# Patient Record
Sex: Female | Born: 1945 | Race: White | Hispanic: No | Marital: Married | Smoking: Former smoker
Health system: Southern US, Community
[De-identification: ages and names within clinical notes are randomized; demographics above are authoritative.]

## PROBLEM LIST (undated history)

## (undated) DIAGNOSIS — J449 Chronic obstructive pulmonary disease, unspecified: Secondary | ICD-10-CM

## (undated) DIAGNOSIS — I10 Essential (primary) hypertension: Secondary | ICD-10-CM

## (undated) DIAGNOSIS — Z853 Personal history of malignant neoplasm of breast: Secondary | ICD-10-CM

## (undated) DIAGNOSIS — K219 Gastro-esophageal reflux disease without esophagitis: Secondary | ICD-10-CM

## (undated) DIAGNOSIS — Z860100 Personal history of colon polyps, unspecified: Secondary | ICD-10-CM

## (undated) DIAGNOSIS — D649 Anemia, unspecified: Secondary | ICD-10-CM

## (undated) DIAGNOSIS — K7460 Unspecified cirrhosis of liver: Secondary | ICD-10-CM

## (undated) DIAGNOSIS — M1990 Unspecified osteoarthritis, unspecified site: Secondary | ICD-10-CM

## (undated) DIAGNOSIS — R011 Cardiac murmur, unspecified: Secondary | ICD-10-CM

## (undated) DIAGNOSIS — E079 Disorder of thyroid, unspecified: Secondary | ICD-10-CM

## (undated) DIAGNOSIS — E538 Deficiency of other specified B group vitamins: Secondary | ICD-10-CM

## (undated) DIAGNOSIS — K222 Esophageal obstruction: Secondary | ICD-10-CM

## (undated) DIAGNOSIS — E785 Hyperlipidemia, unspecified: Secondary | ICD-10-CM

## (undated) HISTORY — PX: BREAST BIOPSY: SHX20

## (undated) HISTORY — DX: Hyperlipidemia, unspecified: E78.5

## (undated) HISTORY — DX: Unspecified cirrhosis of liver: K74.60

## (undated) HISTORY — DX: Hypercalcemia: E83.52

## (undated) HISTORY — DX: Essential (primary) hypertension: I10

## (undated) HISTORY — PX: HERNIA REPAIR: SHX51

## (undated) HISTORY — DX: Disorder of thyroid, unspecified: E07.9

## (undated) HISTORY — DX: Cardiac murmur, unspecified: R01.1

## (undated) HISTORY — DX: Chronic obstructive pulmonary disease, unspecified: J44.9

## (undated) HISTORY — DX: Anemia, unspecified: D64.9

## (undated) HISTORY — DX: Gastro-esophageal reflux disease without esophagitis: K21.9

## (undated) HISTORY — DX: Personal history of malignant neoplasm of breast: Z85.3

## (undated) HISTORY — DX: Deficiency of other specified B group vitamins: E53.8

## (undated) HISTORY — DX: Personal history of colon polyps, unspecified: Z86.0100

## (undated) HISTORY — DX: Personal history of colonic polyps: Z86.010

## (undated) HISTORY — PX: BACK SURGERY: SHX140

## (undated) HISTORY — DX: Esophageal obstruction: K22.2

## (undated) HISTORY — DX: Unspecified osteoarthritis, unspecified site: M19.90

---

## 2016-01-19 DIAGNOSIS — D509 Iron deficiency anemia, unspecified: Secondary | ICD-10-CM | POA: Diagnosis not present

## 2016-01-19 DIAGNOSIS — Z853 Personal history of malignant neoplasm of breast: Secondary | ICD-10-CM | POA: Diagnosis not present

## 2017-02-05 DIAGNOSIS — Z853 Personal history of malignant neoplasm of breast: Secondary | ICD-10-CM | POA: Diagnosis not present

## 2017-02-05 DIAGNOSIS — Z170 Estrogen receptor positive status [ER+]: Secondary | ICD-10-CM | POA: Diagnosis not present

## 2017-02-05 DIAGNOSIS — Z923 Personal history of irradiation: Secondary | ICD-10-CM | POA: Diagnosis not present

## 2017-02-05 DIAGNOSIS — Z9221 Personal history of antineoplastic chemotherapy: Secondary | ICD-10-CM | POA: Diagnosis not present

## 2017-08-16 ENCOUNTER — Telehealth: Payer: Self-pay | Admitting: Gastroenterology

## 2017-08-16 NOTE — Telephone Encounter (Signed)
Would you like to give patient 90 days supply, and how many refills?

## 2017-08-17 MED ORDER — OMEPRAZOLE 20 MG PO CPDR: 20.0000 mg | DELAYED_RELEASE_CAPSULE | Freq: Every day | ORAL | 3 refills | Status: DC

## 2017-08-17 NOTE — Telephone Encounter (Signed)
Sent refill to patients pharmacy. 

## 2017-08-17 NOTE — Telephone Encounter (Signed)
Same dose, 90-day supply , 4 refills Cannot pull up any records-await de-conversion

## 2018-08-05 ENCOUNTER — Other Ambulatory Visit: Payer: Self-pay | Admitting: Gastroenterology

## 2018-09-26 DIAGNOSIS — N390 Urinary tract infection, site not specified: Secondary | ICD-10-CM | POA: Diagnosis not present

## 2018-09-26 DIAGNOSIS — R112 Nausea with vomiting, unspecified: Secondary | ICD-10-CM

## 2018-09-26 DIAGNOSIS — J449 Chronic obstructive pulmonary disease, unspecified: Secondary | ICD-10-CM

## 2018-09-26 DIAGNOSIS — J9611 Chronic respiratory failure with hypoxia: Secondary | ICD-10-CM | POA: Diagnosis not present

## 2018-09-26 DIAGNOSIS — K449 Diaphragmatic hernia without obstruction or gangrene: Secondary | ICD-10-CM

## 2018-09-26 DIAGNOSIS — R109 Unspecified abdominal pain: Secondary | ICD-10-CM | POA: Diagnosis not present

## 2018-09-26 DIAGNOSIS — K8020 Calculus of gallbladder without cholecystitis without obstruction: Secondary | ICD-10-CM | POA: Diagnosis not present

## 2018-09-26 DIAGNOSIS — I10 Essential (primary) hypertension: Secondary | ICD-10-CM

## 2018-09-27 DIAGNOSIS — J9611 Chronic respiratory failure with hypoxia: Secondary | ICD-10-CM | POA: Diagnosis not present

## 2018-09-27 DIAGNOSIS — N390 Urinary tract infection, site not specified: Secondary | ICD-10-CM | POA: Diagnosis not present

## 2018-09-27 DIAGNOSIS — K8020 Calculus of gallbladder without cholecystitis without obstruction: Secondary | ICD-10-CM | POA: Diagnosis not present

## 2018-09-27 DIAGNOSIS — R109 Unspecified abdominal pain: Secondary | ICD-10-CM | POA: Diagnosis not present

## 2018-09-28 DIAGNOSIS — N390 Urinary tract infection, site not specified: Secondary | ICD-10-CM | POA: Diagnosis not present

## 2018-09-28 DIAGNOSIS — J9611 Chronic respiratory failure with hypoxia: Secondary | ICD-10-CM | POA: Diagnosis not present

## 2018-09-28 DIAGNOSIS — K8020 Calculus of gallbladder without cholecystitis without obstruction: Secondary | ICD-10-CM | POA: Diagnosis not present

## 2018-09-28 DIAGNOSIS — R109 Unspecified abdominal pain: Secondary | ICD-10-CM | POA: Diagnosis not present

## 2019-07-31 ENCOUNTER — Other Ambulatory Visit: Payer: Self-pay | Admitting: Gastroenterology

## 2020-01-08 DIAGNOSIS — Z01818 Encounter for other preprocedural examination: Secondary | ICD-10-CM

## 2020-01-09 HISTORY — PX: REPLACEMENT TOTAL KNEE: SUR1224

## 2020-01-27 ENCOUNTER — Other Ambulatory Visit: Payer: Self-pay | Admitting: Gastroenterology

## 2020-03-09 ENCOUNTER — Other Ambulatory Visit: Payer: Self-pay | Admitting: Gastroenterology

## 2020-03-15 DIAGNOSIS — R590 Localized enlarged lymph nodes: Secondary | ICD-10-CM | POA: Insufficient documentation

## 2020-04-16 ENCOUNTER — Telehealth: Payer: Self-pay | Admitting: Oncology

## 2020-04-16 NOTE — Telephone Encounter (Signed)
Patient re-referred for New Dx: Mediastinal Mass.  Appt made for 04/20/2020 Labs 11:30 am - Consult 12 pm  Patient stated she understood she was being referred for abnormal Lymph Nodes

## 2020-04-18 ENCOUNTER — Other Ambulatory Visit: Payer: Self-pay | Admitting: Oncology

## 2020-04-18 DIAGNOSIS — C50412 Malignant neoplasm of upper-outer quadrant of left female breast: Secondary | ICD-10-CM

## 2020-04-20 ENCOUNTER — Other Ambulatory Visit: Payer: Self-pay

## 2020-04-20 ENCOUNTER — Other Ambulatory Visit: Payer: Self-pay | Admitting: Hematology and Oncology

## 2020-04-20 ENCOUNTER — Encounter: Payer: Self-pay | Admitting: Oncology

## 2020-04-20 ENCOUNTER — Inpatient Hospital Stay (INDEPENDENT_AMBULATORY_CARE_PROVIDER_SITE_OTHER): Payer: Medicare Other | Admitting: Oncology

## 2020-04-20 ENCOUNTER — Other Ambulatory Visit: Payer: Self-pay | Admitting: Oncology

## 2020-04-20 ENCOUNTER — Inpatient Hospital Stay: Payer: Medicare Other | Attending: Oncology

## 2020-04-20 VITALS — BP 159/74 | HR 74 | Temp 98.9°F | Resp 18 | Ht 62.0 in | Wt 176.7 lb

## 2020-04-20 DIAGNOSIS — R590 Localized enlarged lymph nodes: Secondary | ICD-10-CM | POA: Diagnosis not present

## 2020-04-20 DIAGNOSIS — C50412 Malignant neoplasm of upper-outer quadrant of left female breast: Secondary | ICD-10-CM

## 2020-04-20 DIAGNOSIS — D509 Iron deficiency anemia, unspecified: Secondary | ICD-10-CM | POA: Diagnosis present

## 2020-04-20 DIAGNOSIS — J9859 Other diseases of mediastinum, not elsewhere classified: Secondary | ICD-10-CM | POA: Diagnosis present

## 2020-04-20 DIAGNOSIS — Z853 Personal history of malignant neoplasm of breast: Secondary | ICD-10-CM | POA: Insufficient documentation

## 2020-04-20 DIAGNOSIS — D649 Anemia, unspecified: Secondary | ICD-10-CM

## 2020-04-20 DIAGNOSIS — J9589 Other postprocedural complications and disorders of respiratory system, not elsewhere classified: Secondary | ICD-10-CM | POA: Diagnosis not present

## 2020-04-20 LAB — HEPATIC FUNCTION PANEL
ALT: 20 (ref 7–35)
AST: 52 — AB (ref 13–35)
Alkaline Phosphatase: 160 — AB (ref 25–125)
Bilirubin, Total: 0.3

## 2020-04-20 LAB — COMPREHENSIVE METABOLIC PANEL
Albumin: 4.2 (ref 3.5–5.0)
Calcium: 9.6 (ref 8.7–10.7)

## 2020-04-20 LAB — BASIC METABOLIC PANEL
BUN: 9 (ref 4–21)
CO2: 25 — AB (ref 13–22)
Chloride: 105 (ref 99–108)
Creatinine: 0.7 (ref 0.5–1.1)
Glucose: 98
Potassium: 4.1 (ref 3.4–5.3)
Sodium: 139 (ref 137–147)

## 2020-04-20 LAB — IRON,TIBC AND FERRITIN PANEL
%SAT: 8.9
Ferritin: 52.1
Iron: 32
TIBC: 357

## 2020-04-20 LAB — CBC: RBC: 4 (ref 3.87–5.11)

## 2020-04-20 LAB — VITAMIN B12: Vitamin B-12: >1000

## 2020-04-20 LAB — CBC AND DIFFERENTIAL
HCT: 34 — AB (ref 36–46)
Hemoglobin: 10.9 — AB (ref 12.0–16.0)
Neutrophils Absolute: 4.82
Platelets: 252 (ref 150–399)
WBC: 6.7

## 2020-04-20 NOTE — Progress Notes (Signed)
[place]  [address] [place],  [place]  [address] [phone]  Clinic Day:  04/20/2020  Referring physician: Raina Mina., MD   This document serves as a record of services personally performed by Hosie Poisson, MD. It was created on their behalf by [facility] E, a trained medical scribe. The creation of this record is based on the scribe's personal observations and the provider's statements to them.   CHIEF COMPLAINT:  CC: Large mediastinal mass  Current Treatment:  Will plan for PET imaging first and possible biopsy   HISTORY OF PRESENT ILLNESS:  Tammy [place] is a 75 y.o. female who I have seen previously, but is now referred by Kathrynn Ducking, NP,  for the evaluation and treatment of a mediastinal mass.  This began when the patient began to experience episodes of dysphagia and occasions of food getting stuck in her throat.  Intermittently she would have to throw her food back up as it would not go down.  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 Lyndel Safe and another time with Dr.Butler.  The last time was 3-4 years ago.  The patient had also noticed a 21 pound unintentional weight loss and decreased appeitte over the course of 6 months.  She followed up with her primary care provider and CT imaging was pursued.  CT chest, abdomen and pelvis from December 6th revealed an enlarged 3.1 cm mediastinal lymph node, concerning for nodal metastatic disease.   There is a 3.5 x 1.7 cm rim enhancing fluid collection in the left breast with surrounding fat stranding and left-sided breast skin thickening, but mammography confirmed a seroma of the left breast.  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  She was treated with lumpectomy.  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  Estrogen and progesterone receptors were positive and her 2 Neu negative.  She received adjuvant chemotherapy with docetaxel and cyclophosphamide for 4 cycles, followed by postoperative radiation to the left breast.  She was placed on anastrozole 1 mg daily in June 2013, but stopped it on her own, because of severe arthralgias and myalgias.  She was then switched to tamoxifen, but discontinued this on her own as well.   INTERVAL HISTORY:  Jillana states that she has night sweats as well as sweats during the day despite being chilled.  She reports fatigue and weakness.  She denies any palpable adenopathy.  She has shortness of breath, cough and wheezing due to COPD, which is stable.  She is currently on oral iron supplement every other day, and oral B12 and D3 supplement.  She still has occasions of dysphagia, and was being referred to Dr. Lyda Jester, but he refused her as she was previously seen by Dr. Lyndel Safe.  She has yet to schedule with him.  She denies odynophagia.  She has back pain which she rates as an 8/10 and has had 2 prior back surgeries.  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  She uses tramadol for pain and continues physical therapy.  She has had a couple of falls a few weeks ago.  Blood counts and chemistries are unremarkable except for a hemoglobin of 10.9, and a mildly elevated SGOT of 52.  LDH  is normal at 510.  Her appetite is up and down, but she has lost nearly 22 pounds over 2 years time.  She denies fever, chills or other signs of infection.  She denies nausea, vomiting, bowel issues, or abdominal pain.  She denies sore throat or chest pain.  She has had her COVID vaccines early in 2021.  She is accompanied by her husband today.    REVIEW OF SYSTEMS:  Review of Systems  Constitutional: Positive for chills and fatigue.       Generalized weakness; Night sweats  HENT:          Intermittent episodes of dysphagia  Eyes: Negative.   Respiratory: Positive for cough, shortness of breath (due to COPD, stable) and wheezing.   Cardiovascular: Negative.   Gastrointestinal: Negative.   Endocrine: Negative.   Genitourinary: Negative.    Musculoskeletal: Positive for arthralgias, back pain and gait problem (unsteady).  Skin: Negative.   Neurological: Positive for gait problem (unsteady).  Hematological: Negative.  Negative for adenopathy.  Psychiatric/Behavioral: Negative.      VITALS:  Blood pressure (!) 159/74, pulse 74, temperature 98.9 F (37.2 C), temperature source Oral, resp. rate 18, height 5\' 2"  (1.575 m), weight 176 lb 11.2 oz (80.2 kg), SpO2 93 %.  Wt Readings from Last 3 Encounters:  04/20/20 176 lb 11.2 oz (80.2 kg)    Body mass index is 32.32 kg/m.  Performance status (ECOG): 1 - Symptomatic but completely ambulatory  PHYSICAL EXAM:  Physical Exam Constitutional:      General: She is not in acute distress.    Appearance: Normal appearance. She is normal weight.  HENT:     Head: Normocephalic and atraumatic.  Eyes:     General: No scleral icterus.    Extraocular Movements: Extraocular movements intact.     Conjunctiva/sclera: Conjunctivae normal.     Pupils: Pupils are equal, round, and reactive to light.  Cardiovascular:     Rate and Rhythm: Normal rate and regular rhythm.     Pulses: Normal pulses.     Heart sounds: Normal heart sounds. No murmur heard. No friction rub. No gallop.   Pulmonary:     Effort: Pulmonary effort is normal. No respiratory distress.     Breath sounds: Normal breath sounds.  Chest:     Comments: She has a well healed scar in the lower outer quadrant of the left breast with a firm seroma measuring 3-4 cm.  Both breasts are without masses. Abdominal:     General: Bowel sounds are normal. There is no distension.     Palpations: Abdomen is soft. There is no mass.     Tenderness: There is no abdominal tenderness.   Musculoskeletal:        General: Normal range of motion.     Cervical back: Normal range of motion and neck supple.     Right lower leg: No edema.     Left lower leg: No edema.     Comments: Left knee is warm and swollen  Lymphadenopathy:     Cervical: No cervical adenopathy.  Skin:    General: Skin is warm and dry.  Neurological:     General: No focal deficit present.     Mental Status: She is alert and oriented to person, place, and time. Mental status is at baseline.  Psychiatric:        Mood and Affect: Mood normal.        Behavior: Behavior normal.  Thought Content: Thought content normal.        Judgment: Judgment normal.     LABS:  No flowsheet data found. No flowsheet data found.   No results found for: CEA1 / No results found for: CEA1 No results found for: TIBC, FERRITIN, IRONPCTSAT No results found for: LDH  STUDIES:   She underwent CT chest, abdomen and pelvis with contrast on 03/15/2020 showing: 1.  There is a 3.5 x 1.7 cm rim enhancing fluid collection in the left breast with surrounding fat stranding and left-sided breast skin thickening.  Findings may represent a postoperative seroma or hematoma in the appropriate clinical setting.  An abscess is not entirely excluded.  Correlation with the patient's history is recommended.  Alternatively, this may represent necrotic node or mass.  Left-sided breast skin thickening is noted.  Correlation with physical exam is recommended.  Mammography is strongly recommended if this has not been recently performed. 2.  Pathologically enlarged mediastinal lymph node measuring up to 3.1 cm.  This is concerning for a nodal metastatic disease with an unknown primary. 3.  Cholelithiasis without acute inflammation. 4.  Nodular liver surface, concerning for underlying cirrhosis. 5.  Moderate-sized hiatal hernia. 6.  Unchanged appearance of the patient's lumbar fusion hardware with the right L4 pedicle screw coursing through the  L3-L4 disc space.  There is probable loosening of the left L4 pedicle screw.  She underwent digital diagnostic bilateral mammogram with tomography on 03/25/2020 showing: breast density category B.  Expected postoperative changes in the LEFT breast which account for the recent CT findings. No mammographic evidence for malignancy.  HISTORY:   Past Medical History:  Diagnosis Date  . Anemia    iron deficiency  . Arthritis   . B12 deficiency   . COPD (chronic obstructive pulmonary disease) ([place])   . Esophageal stenosis   . GERD (gastroesophageal reflux disease)   . Heart murmur   . History of breast cancer   . History of colon polyps   . Hypercalcemia   . Hyperlipidemia   . Hypertension   . Liver cirrhosis ([place])   . Thyroid disease    hypothyroidism    Past Surgical History:  Procedure Laterality Date  . BACK SURGERY     x2  . BREAST BIOPSY     x2  . HERNIA REPAIR    . REPLACEMENT TOTAL KNEE      History reviewed. No pertinent family history.  Social History:  reports that she has quit smoking. Her smoking use included cigarettes. She has a 15.00 pack-year smoking history. She has never used smokeless tobacco. She reports that she does not drink alcohol and does not use drugs.The patient is accompanied by her husband today.  She is married and lives at home with her spouse.  She has 3 children.  She had her 1st child at age 89.  She is retired from work, and has never been exposed to chemicals.     Allergies: No Known Allergies  Current Medications: Current Outpatient Medications  Medication Sig Dispense Refill  . albuterol (VENTOLIN HFA) 108 (90 Base) MCG/ACT inhaler Inhale into the lungs.    . cholecalciferol (VITAMIN D3) 25 MCG (1000 UNIT) tablet Take 1,000 Units by mouth daily.    . diclofenac Sodium (VOLTAREN) 1 % GEL Apply topically as directed.    . simvastatin (ZOCOR) 10 MG tablet Take 1 tablet by mouth daily.    . traMADol (ULTRAM) 50 MG tablet     . amLODipine  (  NORVASC) 5 MG tablet Take 5 mg by mouth daily.    Marland Kitchen gabapentin (NEURONTIN) 300 MG capsule Take by mouth.    . levothyroxine (SYNTHROID) 50 MCG tablet Take 50 mcg by mouth daily.    Marland Kitchen lisinopril (ZESTRIL) 20 MG tablet Take 20 mg by mouth daily.    Marland Kitchen omeprazole (PRILOSEC) 20 MG capsule TAKE 1 CAPSULE(20 MG) BY MOUTH DAILY 90 capsule 1  . vitamin B-12 (CYANOCOBALAMIN) 500 MCG tablet Take by mouth.     No current facility-administered medications for this visit.     ASSESSMENT & PLAN:   Assessment:   1.  Enlarged 3.1 cm mediastinal lymph node, suspicious for nodal metastatic disease.  This could represent lymphoma, especially with her sweats and weight loss, or metastatic breast cancer.  I do not see evidence of another primary on CT scans.  We will plan to pursue PET imaging for further evaluation and she will need a biopsy.  2.   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  She was treated with lumpectomy, and 1 sentinel node positive for metastasis with extracapsular extension.  Estrogen and progesterone receptors were positive and her 2 Neu negative.  She received adjuvant chemotherapy with docetaxel and cyclophosphamide for 4 cycles, followed by postoperative radiation to the left breast.  She did not tolerate hormonal therapy   3.  Episodes of dysphagia without odynophagia.  She does have a history of esophageal stricture.  Eventually she will need to follow up with Dr. Lyndel Safe.  4.  COPD with chronic shortness of breath, wheezing and cough, stable.  5.  Iron deficiency anemia, currently on oral iron supplement every other day.  I will add iron studies to her labs today.  6.  B12 deficiency, currently on oral supplement.  I will check a B12 and folate today.  Plan: This is a pleasant 75 year old female with an enlarged 3.1 mediastinal lymph node, which is suspicious for nodal metastatic disease.  At this time we will pursue PET  imaging to determine if this is a hypermetabolic node, and to rule out other malignancy.  She will eventually need a biopsy, most likely by mediastinoscopy.  She and her husband understand and agree with this plan of care.  I have answered her questions and she knows to call with any concerns.  Thank you for the opportunity to participate in the care of your patients   I provided 50 minutes of face-to-face time during this this encounter and > 50% was spent counseling as documented under my assessment and plan.    Derwood Kaplan, MD [facility] AT [facility] [address] [place] [address] Dept: [phone] Dept Fax: [phone]   I, Rita Ohara, am acting as scribe for Derwood Kaplan, MD  I have reviewed this report as typed by the medical scribe, and it is complete and accurate.

## 2020-04-21 ENCOUNTER — Other Ambulatory Visit: Payer: Self-pay | Admitting: Hematology and Oncology

## 2020-04-21 LAB — FOLATE: Folate: 6.44

## 2020-04-21 LAB — CANCER ANTIGEN 27.29: CA 27.29: 21.1 U/mL (ref 0.0–38.6)

## 2020-04-21 LAB — CEA: CEA: 3.4 ng/mL (ref 0.0–4.7)

## 2020-04-27 ENCOUNTER — Ambulatory Visit: Payer: Medicare Other | Admitting: Oncology

## 2020-04-27 ENCOUNTER — Other Ambulatory Visit: Payer: Medicare Other

## 2020-04-29 DIAGNOSIS — D500 Iron deficiency anemia secondary to blood loss (chronic): Secondary | ICD-10-CM | POA: Insufficient documentation

## 2020-05-10 ENCOUNTER — Encounter: Payer: Self-pay | Admitting: Oncology

## 2020-05-12 NOTE — Progress Notes (Signed)
[facility]  [address] [place],  [place]  [address] [phone]  Clinic Day:  05/13/2020  Referring physician: Raina Mina., MD   This document serves as a record of services personally performed by Hosie Poisson, MD. It was created on their behalf by [facility] E, a trained medical scribe. The creation of this record is based on the scribe's personal observations and the provider's statements to them.   CHIEF COMPLAINT:  CC: Large mediastinal mass  Current Treatment:  Will plan for PET imaging first and possible biopsy   HISTORY OF PRESENT ILLNESS:  Tammy Cortez is a 75 y.o. female who I have seen previously, but is now referred by Kathrynn Ducking, NP,  for the evaluation and treatment of a mediastinal mass.  This began when the patient began to experience episodes of dysphagia and occasions of food getting stuck in her throat.  Intermittently she would have to throw her food back up as it would not go down.  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 Lyndel Safe and another time with Dr.Butler.  The last time was 3-4 years ago.  The patient had also noticed a 21 pound unintentional weight loss and decreased appeitte over the course of 6 months.  She followed up with her primary care provider and CT imaging was pursued.  CT chest, abdomen and pelvis from December 6th revealed an enlarged 3.1 cm mediastinal lymph node, concerning for nodal metastatic disease.   There is a 3.5 x 1.7 cm rim enhancing fluid collection in the left breast with surrounding fat stranding and left-sided breast skin thickening, but mammography confirmed a seroma of the left breast.  She does report night sweats, fatigue and weakness.  She has shortness of breath, cough and wheezing due to COPD, which is stable.  She is currently on oral iron supplement every other day, and oral B12 and D3 supplement.   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  She was treated with lumpectomy.  Pathology revealed a 1.2 cm, grade 1, invasive ductal carcinoma, as well as 1 sentinel node positive for metastasis with extracapsular extension.  Estrogen and progesterone receptors were positive and her 2 Neu negative.  She received adjuvant chemotherapy with docetaxel and cyclophosphamide for 4 cycles, followed by postoperative radiation to the left breast.  She was placed on anastrozole 1 mg daily in June 2013, but stopped it on her own, because of severe arthralgias and myalgias.  She was then switched to tamoxifen, but discontinued this on her own as well.   INTERVAL HISTORY:  Tammy Cortez is here for routine follow up to review imaging results.   PET imaging from January 31st confirmed the right paratracheal lymphadenopathy to be markedly hypermetabolic with an SUV max of 15.2, measuring 2.6 cm.  CT imaging from February 1st was negative for any new or progressive findings.  The small mediastinal and right infrahilar lymph nodes remain stable.  There is also no findings for recurrent tumor within the left breast or axillary adenopathy.  She states that she is doing well, but does note constant dizziness and headaches.  She has had some falls.  We will obtain MRI head imaging for further evaluation.  She was found to be anemic and iron deficient at her last visit.  She states that she has been taking oral iron supplement for the past  couple of months, 3 times per week, and her hemoglobin has slowly improved from 9.6 to 10.9.  I would like her to take this daily, but as she already has issues with constipation, she will increase this to 4 times per week.  Her  appetite is good, and she has lost 4 pounds since her last visit.  She denies fever, chills or other signs of infection.  She denies nausea, vomiting, bowel issues, or abdominal pain.  She denies sore throat, cough, dyspnea, or chest  pain.  She is accompanied by her husband today.  REVIEW OF SYSTEMS:  Review of Systems  Constitutional: Negative.   HENT:  Negative.   Eyes: Negative.   Respiratory: Negative.   Cardiovascular: Negative.   Gastrointestinal: Negative.   Endocrine: Negative.   Genitourinary: Negative.    Musculoskeletal: Positive for gait problem (unsteadiness).  Skin: Negative.   Neurological: Positive for dizziness (constant), gait problem (unsteadiness) and headaches.       Occasional falls  Hematological: Negative.   Psychiatric/Behavioral: Negative.      VITALS:  Blood pressure (!) 149/67, pulse 72, temperature 98.2 F (36.8 C), temperature source Oral, resp. rate 18, height 5\' 2"  (1.575 m), weight 172 lb 9.6 oz (78.3 kg), SpO2 95 %.  Wt Readings from Last 3 Encounters:  05/13/20 172 lb 9.6 oz (78.3 kg)  04/20/20 176 lb 11.2 oz (80.2 kg)    Body mass index is 31.57 kg/m.  Performance status (ECOG): 1 - Symptomatic but completely ambulatory  PHYSICAL EXAM:  Physical Exam Constitutional:      General: She is not in acute distress.    Appearance: Normal appearance. She is normal weight.  HENT:     Head: Normocephalic and atraumatic.     Ears:     Comments: Right ear is ceruminous.  The left ear has some fibrosis of the ear drum, but otherwise is normal. Eyes:     General: No scleral icterus.    Extraocular Movements: Extraocular movements intact.     Conjunctiva/sclera: Conjunctivae normal.     Pupils: Pupils are equal, round, and reactive to light.  Cardiovascular:     Rate and Rhythm: Normal rate and regular rhythm.     Pulses: Normal pulses.     Heart sounds: Normal heart sounds. No murmur heard. No friction rub. No gallop.   Pulmonary:     Effort: Pulmonary effort is normal. No respiratory distress.     Breath sounds: Normal breath sounds.  Abdominal:     General: Bowel sounds are normal. There is no distension.     Palpations: Abdomen is soft. There is hepatomegaly (just  below the right costal margin, slightly nodular). There is no mass.     Tenderness: There is no abdominal tenderness.  Musculoskeletal:        General: Normal range of motion.     Cervical back: Normal range of motion and neck supple.     Right lower leg: No edema.     Left lower leg: No edema.  Lymphadenopathy:     Cervical: No cervical adenopathy.  Skin:    General: Skin is warm and dry.  Neurological:     General: No focal deficit present.     Mental Status: She is alert and oriented to person, place, and time. Mental status is at baseline.     Coordination: Romberg sign positive.     Comments: Very unsteady gait  Psychiatric:        Mood and Affect:  Mood normal.        Behavior: Behavior normal.        Thought Content: Thought content normal.        Judgment: Judgment normal.     LABS:   CBC Latest Ref Rng & Units 04/20/2020  WBC - 6.7  Hemoglobin 12.0 - 16.0 10.9(A)  Hematocrit 36 - 46 34(A)  Platelets 150 - 399 252   CMP Latest Ref Rng & Units 04/20/2020  BUN 4 - 21 9  Creatinine 0.5 - 1.1 0.7  Sodium 137 - 147 139  Potassium 3.4 - 5.3 4.1  Chloride 99 - 108 105  CO2 13 - 22 25(A)  Calcium 8.7 - 10.7 9.6  Alkaline Phos 25 - 125 160(A)  AST 13 - 35 52(A)  ALT 7 - 35 20     Lab Results  Component Value Date   CEA1 3.4 04/20/2020   /  CEA  Date Value Ref Range Status  04/20/2020 3.4 0.0 - 4.7 ng/mL Final    Comment:    (NOTE)                             Nonsmokers          <3.9                             Smokers             <5.6 [employer] Electrochemiluminescence Immunoassay (ECLIA) Values obtained with different assay methods or kits cannot be used interchangeably.  Results cannot be interpreted as absolute evidence of the presence or absence of malignant disease. Performed At: [facility] [place], [place] [id number] Rush Farmer MD [id number]    Lab Results  Component Value Date   TIBC 357 04/20/2020    FERRITIN 52.1 04/20/2020   IRONPCTSAT 8.9 04/20/2020   No results found for: LDH  STUDIES:   She underwent a nuclear PET skull base to thigh on 05/10/2020 showing: 1.  The right paratracheal lymphadenopathy is markedly hypermetabolic with an SUV max of 15.2, measuring 2.6 cm, compatible with metastatic disease or lymphoproliferative disorder/lymphoma. No other hypermetabolic lymphadenopathy in the neck, chest, abdomen, or pelvis. 2.  Small focus of hypermetabolism in the right colon with an SUV max of 4.8. Colon is otherwise diffusely poorly FDG avid. Given this markedly focal uptake, a small adenoma or carcinoma cannot be excluded. Correlation with colorectal cancer screening history recommended.   She underwent a complete abdominal ultrasound on 05/11/2020 showing: 1.  Choleithiasis, without associated inflammatory changes. 2.  Possible cirrhosis.  No focal hepatic lesion is seen.   She underwent a CT chest, abdomen and pelvis with contrast on 05/11/2020 showing: 1.  Stable 2.8 cm right paratracheal nodal mass.  This was markedly hypermetabolic on the recent PET-CT.  No new or progressive findings.   2.  Stable small mediastinal and right infrahilar lymph nodes. 3.  Stable surgical changes involving the left breast with a chronic postop fluid collection.  No findings for recurrent tumor or axillary adenopathy. 4.  Stable advanced atherosclerotic calcifications involving the thoracic and abdominal aorta and branch vessels including three-vessel coronary artery calcifications. 5.  Cirrhotic changes involving the liver.  No worrisome hepatic lesions. 6.  Stable bilateral adrenal gland nodules. 7.  Stable moderate-sized hiatal hernia. 8.  Emphysema and aortic atherosclerosis.   HISTORY:   Allergies: No  Known Allergies  Current Medications: Current Outpatient Medications  Medication Sig Dispense Refill  . albuterol (VENTOLIN HFA) 108 (90 Base) MCG/ACT inhaler Inhale into the lungs.     Marland Kitchen amLODipine (NORVASC) 5 MG tablet Take 5 mg by mouth daily.    . cholecalciferol (VITAMIN D3) 25 MCG (1000 UNIT) tablet Take 1,000 Units by mouth daily.    . diclofenac Sodium (VOLTAREN) 1 % GEL Apply topically as directed.    . gabapentin (NEURONTIN) 300 MG capsule Take by mouth.    . levothyroxine (SYNTHROID) 50 MCG tablet Take 50 mcg by mouth daily.    Marland Kitchen lisinopril (ZESTRIL) 20 MG tablet Take 20 mg by mouth daily.    Marland Kitchen omeprazole (PRILOSEC) 20 MG capsule TAKE 1 CAPSULE(20 MG) BY MOUTH DAILY 90 capsule 1  . ondansetron (ZOFRAN-ODT) 4 MG disintegrating tablet Take 4 mg by mouth every 8 (eight) hours as needed.    . simvastatin (ZOCOR) 10 MG tablet Take 1 tablet by mouth daily.    . traMADol (ULTRAM) 50 MG tablet     . vitamin B-12 (CYANOCOBALAMIN) 500 MCG tablet Take by mouth.     No current facility-administered medications for this visit.     ASSESSMENT & PLAN:   Assessment:   1.  Enlarged 3.1 cm mediastinal lymph node, suspicious for nodal metastatic disease.  This could represent lymphoma, especially with her sweats and weight loss, or metastatic breast cancer.  I do not see evidence of another primary on CT scans or PET imaging.  We will now be pursuing a transbronchial biopsy, and so we will need to refer her to a pulmonologist.  2.   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  She was treated with lumpectomy, and 1 sentinel node positive for metastasis with extracapsular extension.  Estrogen and progesterone receptors were positive and her 2 Neu negative.  She received adjuvant chemotherapy with docetaxel and cyclophosphamide for 4 cycles, followed by postoperative radiation to the left breast.  She did not tolerate hormonal therapy   3.  Episodes of dysphagia without odynophagia.  She does have a history of esophageal stricture.  Eventually she will need to follow up with Dr. Lyndel Safe.  4.  COPD with chronic shortness of  breath, wheezing and cough, stable.  5.  Iron deficiency anemia, currently on oral iron supplement three days per week, with partial response.  I advised that she increase this to daily, but as she already struggles with constipation, she will start with 4 days per week and increase as tolerated.  6.  B12 deficiency, currently on oral supplement. B12 and folate levels were normal.  7.  Constant dizziness with some falls and headaches.  We will obtain MRI head imaging for completeness.  Plan: This is a pleasant 75 year old female with an enlarged 3.1 mediastinal lymph node, which is suspicious for nodal metastatic disease versus lymphoma. PET imaging confirmed this to be markedly hypermetabolic measuring 2.6 cm.  CT imaging was negative for any new or progressive findings.  The small mediastinal and right infrahilar lymph nodes remain stable.  There is also no findings for recurrent tumor within the left breast or axillary adenopathy.  She will now need biopsy for pathologic confirmation, and so we will need to refer her to a pulmonologist for transbronchial biospy.  If this is recurrent breast cancer, we will need estrogen receptors and prognostic profile.  If this is lymphoma, we would need to  type which kind through further testing.  As she has constant dizziness, headaches and has had some falls, I will order an MRI head for further evaluation.  She does not do well with this type of imaging, and so I will prescribe Ativan 1 mg to use for anxiety.  She will increase oral iron supplement as tolerated but will start with 4 days per week.  Once we receive the pathology results, we will bring her back for further discussion and to finalize a treatment plan.  She and her husband understand and agree with this plan of care.  I have answered her questions and she knows to call with any concerns.   I provided 30 minutes of face-to-face time during this this encounter and > 50% was spent counseling as  documented under my assessment and plan.    Derwood Kaplan, MD [facility] AT [facility] [address] [place] [address] Dept: [phone] Dept Fax: [phone]   I, Rita Ohara, am acting as scribe for Derwood Kaplan, MD  I have reviewed this report as typed by the medical scribe, and it is complete and accurate.

## 2020-05-13 ENCOUNTER — Telehealth: Payer: Self-pay

## 2020-05-13 ENCOUNTER — Other Ambulatory Visit: Payer: Self-pay

## 2020-05-13 ENCOUNTER — Other Ambulatory Visit: Payer: Self-pay | Admitting: Oncology

## 2020-05-13 ENCOUNTER — Other Ambulatory Visit: Payer: Self-pay | Admitting: Hematology and Oncology

## 2020-05-13 ENCOUNTER — Inpatient Hospital Stay: Payer: Medicare Other | Attending: Oncology | Admitting: Oncology

## 2020-05-13 ENCOUNTER — Encounter: Payer: Self-pay | Admitting: Oncology

## 2020-05-13 VITALS — BP 149/67 | HR 72 | Temp 98.2°F | Resp 18 | Ht 62.0 in | Wt 172.6 lb

## 2020-05-13 DIAGNOSIS — D500 Iron deficiency anemia secondary to blood loss (chronic): Secondary | ICD-10-CM

## 2020-05-13 DIAGNOSIS — R590 Localized enlarged lymph nodes: Secondary | ICD-10-CM | POA: Diagnosis not present

## 2020-05-13 MED ORDER — LORAZEPAM 1 MG PO TABS: ORAL_TABLET | ORAL | 0 refills | Status: DC

## 2020-05-13 NOTE — Telephone Encounter (Signed)
-----   Message from Derwood Kaplan, MD sent at 05/13/2020  3:02 PM EST ----- Regarding: referral Pls refer pulm in [place] ASAP for transbronchial bx of mediastinal adenopathy in pt. With hx of breast cancer

## 2020-05-13 NOTE — Telephone Encounter (Signed)
Faxed referral to [facility] at [facility].

## 2020-05-14 ENCOUNTER — Telehealth: Payer: Self-pay | Admitting: *Deleted

## 2020-05-14 NOTE — Telephone Encounter (Signed)
Pt received moderna vaccines 1st dose on 06-06-2019 and 2nd dose on 07-04-2019. Pt will go back to [facility] to get the booster

## 2020-05-24 ENCOUNTER — Institutional Professional Consult (permissible substitution): Payer: Medicare Other | Admitting: Pulmonary Disease

## 2020-06-23 ENCOUNTER — Institutional Professional Consult (permissible substitution): Payer: Medicare Other | Admitting: Pulmonary Disease

## 2020-07-25 ENCOUNTER — Other Ambulatory Visit: Payer: Self-pay | Admitting: Gastroenterology

## 2020-07-29 ENCOUNTER — Encounter: Payer: Self-pay | Admitting: Gastroenterology

## 2020-08-22 DIAGNOSIS — I351 Nonrheumatic aortic (valve) insufficiency: Secondary | ICD-10-CM | POA: Diagnosis not present

## 2020-09-05 DIAGNOSIS — R578 Other shock: Secondary | ICD-10-CM | POA: Diagnosis not present

## 2020-09-05 DIAGNOSIS — K922 Gastrointestinal hemorrhage, unspecified: Secondary | ICD-10-CM | POA: Diagnosis not present

## 2020-09-05 NOTE — Progress Notes (Addendum)
[facility] Progress Note Patient Name: Tammy Cortez DOB: Dec 12, 1945 MRN: [id number]   Date of Service  09/05/2020  HPI/Events of Note  Brief new admit note:  Transferred from [facility] for hematemesis with Hg 5 for GI consultation/further care.   75 year old female with history of esophageal stenosis requiring dilation with Dr. Lyndel Safe, HTN, acquired hypothyroidism, COPD, and breast cancer who presents from [facility]. She was initally admitted after a COPD exacerbation and asp pneumonia.  Camera: Discussed with RN. Finishing 2 PRBC. Breathing irregular, obese. Aphasic from baseline. Encephalopathy. NG tube. Still has darker, red drainage. HR 92, 97% on room air. 130/77. DNR status as per RN..  Data: Reviewed.  AST 52, Hg 10.9( 04/2020).   A/P 1. Upper GI bleeding. Severe anemia. No hx of prior bleeding or on blood thinner.  2. Asp Pneumonitis/COPD 3. HTN 4. Breast Cancer hx.   eICU Interventions  - get stat ABG, CxR and post PRBC CBC.  GI consultation for scopy.  - asp precautions - continue abx. - VTE: SCD for now - SSI: goals < 180.  CCM team aware, going to see her soon.       Intervention Category Major Interventions: Hemorrhage - evaluation and management Evaluation Type: New Patient Evaluation  Elmer Sow 09/05/2020, 11:56 PM   2:24 Lab unable to draw stat labs at this time on [name] because she is finishing a unit of blood.  Per lab, they must wait 2h after blood transfused.  They will draw all labs including TXM in 2 hrs. Ok.

## 2020-09-05 NOTE — H&P (Addendum)
NAME:  Tammy Cortez, MRN:  [id number], DOB:  1946/03/21, LOS: 0 ADMISSION DATE:  (Not on file), CONSULTATION DATE:  09/05/20 REFERRING MD:  NA, CHIEF COMPLAINT:  GI bleed  History of Present Illness:   This is a 75 year old female with history of esophageal stenosis requiring dilation with Dr. Lyndel Safe, HTN, acquired hypothyroidism, COPD, cirrhosis of the liver, HTN,and breast cancer(with right paratracheal lymph node recently seen in 2022 that was pet avid but has not been biopsied as far as we can tell.) who presents from [facility]. She was initally admitted after a COPD exacerbation for presumed aspiration pneumonia and encephailits secondary to this. She was altered presumed to be from aspiration pna. She had a stroke 8 weeks prior to this with right sided deficits thereafter. She was becoming increasingly more septic requiring fluid bolus's and escalation of abx to vancomycin and Zosyn. Accompanying the drop in blood pressures patient had a drop in hgb from 8.6 to 5.0. Also noted to have dark tarry stools.  Shortly after these lab abnormalities were appreciated patient had a large volume hematemesis. Per transferring physicioan patietn has no history of GI bleed in past. She is not a blood thinner regularly. Her INR was 1.0. Given that there is not a GI service at [facility] it was decided to transfer patient to our ICU for for further evaluation of her GI bleed.     Had head CT that showed no acute intracranial pathology on 09/05/20  CT abdomen January 04/30/20 noting liver with cirrhotic morphology.    Pertinent  Medical History  HTN Hypothyroidism Breast cancer with hypermetabolic lymph node that has nbto biopsied yet esophageal stenosis requiring dilation in the past COPD  Significant Hospital Events: Including procedures, antibiotic start and stop dates in addition to other pertinent events   . Patietn admitted to the ICU on 09/05/20  Interim History / Subjective:     Objective   There were no vitals taken for this visit. PAP: ()/()      No intake or output data in the 24 hours ending 09/05/20 2138 There were no vitals filed for this visit.  Examination: General: Patient altered and agitated. Niot following commands HENT: Moist mucous membranes Lungs: tachypniec without any wheezes Cardiovascular: RRR Abdomen: Soft non tedner and non distended Extremities: Warm to touch Neuro: Unintelligible. Does not move RUE to pain. Moves all other extremities to pain GU: Foley in place  Labs/imaging that I havepersonally reviewed  (right click and "Reselect all SmartList Selections" daily)  Hgb 5.1 AST of 79  AL of 45 at OSH Lipase 75 at [facility] Problem list   NA  Assessment & Plan:  This is a 75 yo female with history as noted above who presents with GI bleed.    GI bleed. Likely upper in nature. Given bright red blood coming out of NG tube. No stigmatta of liver failure to suspect varices.  However patient did have CT scan in January that noted cirrhotic liver. -CBC Q 4h consult GI in AM.  -Protonix -Hold all AC -CTA abdomen   Aspiration PNA-Noted at OSH, SP stroke -continue vanc and zosyn for now -CXR here -ABG -Patient DNR so no intuabtion  Hypothyroidism- -Continue home syntrhoid  COPD- -Unclear if in exacerbation but unlikely -Brovana, Pulmicort, scheduled duonebs  History of stroke with paralysis of RUE -Appreciated and noted  -Will need swallow eval before allowed to take PO  Transaminits-Patient with AST of 79 at OSH and ALT of  45 here. Repeat AST of 18 and 187. -Will attain CTA abdomen scan with contrast to better elucidate possible bleed and worsening transaminases.      Best practice (right click and "Reselect all SmartList Selections" daily)  Diet: NPO Pain/Anxiety/Delirium protocol (if indicated): NA VAP protocol (if indicated): NA DVT prophylaxis:Contraindicated GI prophylaxis: Protonix IV  BID Glucose control:  Q4hr checks with correctional as needed Central venous access:  NA Arterial line: NA Foley:  NA Mobility:  Bed rest for now PT consulted: TBD Last date of multidisciplinary goals of care discussion [TBD] Code Status:DNR per Signed DNR order Disposition: ICU  Labs   CBC: No results for input(s): WBC, NEUTROABS, HGB, HCT, MCV, PLT in the last 168 hours.  Basic Metabolic Panel: No results for input(s): NA, K, CL, CO2, GLUCOSE, BUN, CREATININE, CALCIUM, MG, PHOS in the last 168 hours. GFR: CrCl cannot be calculated (Patient's most recent lab result is older than the maximum 21 days allowed.). No results for input(s): PROCALCITON, WBC, LATICACIDVEN in the last 168 hours.  Liver Function Tests: No results for input(s): AST, ALT, ALKPHOS, BILITOT, PROT, ALBUMIN in the last 168 hours. No results for input(s): LIPASE, AMYLASE in the last 168 hours. No results for input(s): AMMONIA in the last 168 hours.  ABG No results found for: PHART, PCO2ART, PO2ART, HCO3, TCO2, ACIDBASEDEF, O2SAT   Coagulation Profile: No results for input(s): INR, PROTIME in the last 168 hours.  Cardiac Enzymes: No results for input(s): CKTOTAL, CKMB, CKMBINDEX, TROPONINI in the last 168 hours.  HbA1C: No results found for: HGBA1C  CBG: No results for input(s): GLUCAP in the last 168 hours.  Review of Systems:   Unable to attain given patients mental status  Past Medical History:  She,  has a past medical history of Anemia, Arthritis, B12 deficiency, COPD (chronic obstructive pulmonary disease) (HCC), Esophageal stenosis, GERD (gastroesophageal reflux disease), Heart murmur, History of breast cancer, History of colon polyps, Hypercalcemia, Hyperlipidemia, Hypertension, Liver cirrhosis ([place]), and Thyroid disease.   Surgical History:   Past Surgical History:  Procedure Laterality Date  . BACK SURGERY     x2  . BREAST BIOPSY     x2  . HERNIA REPAIR    . REPLACEMENT TOTAL KNEE  Left 01/2020     Social History:   reports that she has quit smoking. Her smoking use included cigarettes. She has a 15.00 pack-year smoking history. She has never used smokeless tobacco. She reports that she does not drink alcohol and does not use drugs.   Family History:  Her family history is not on file.   Allergies No Known Allergies   Home Medications  Prior to Admission medications   Medication Sig Start Date End Date Taking? Authorizing Provider  albuterol (VENTOLIN HFA) 108 (90 Base) MCG/ACT inhaler Inhale into the lungs. 10/02/18   [provider]  amLODipine (NORVASC) 5 MG tablet Take 5 mg by mouth daily. 03/29/20   [provider]  cholecalciferol (VITAMIN D3) 25 MCG (1000 UNIT) tablet Take 1,000 Units by mouth daily.    [provider]  diclofenac Sodium (VOLTAREN) 1 % GEL Apply topically as directed.    [provider]  gabapentin (NEURONTIN) 300 MG capsule Take by mouth. 04/14/20   [provider]  levothyroxine (SYNTHROID) 50 MCG tablet Take 50 mcg by mouth daily. 03/29/20   [provider]  lisinopril (ZESTRIL) 20 MG tablet Take 20 mg by mouth daily. 03/29/20   [provider]  LORazepam (ATIVAN) 1  MG tablet Take one tablet one hour prior to procedure and 1/2 to one tablet at time of procedure 05/13/20   Dayton Scrape A, NP  omeprazole (PRILOSEC) 20 MG capsule TAKE 1 CAPSULE(20 MG) BY MOUTH DAILY 03/09/20   Jackquline Denmark, MD  ondansetron (ZOFRAN-ODT) 4 MG disintegrating tablet Take 4 mg by mouth every 8 (eight) hours as needed. 05/03/20   [provider]  simvastatin (ZOCOR) 10 MG tablet Take 1 tablet by mouth daily. 01/08/16   [provider]  traMADol Veatrice Bourbon) 50 MG tablet  03/15/16   [provider]  vitamin B-12 (CYANOCOBALAMIN) 500 MCG tablet Take by mouth.    [provider]     Critical care time: 55 minutes

## 2020-09-06 ENCOUNTER — Inpatient Hospital Stay (HOSPITAL_COMMUNITY): Payer: Medicare Other

## 2020-09-06 ENCOUNTER — Encounter (HOSPITAL_COMMUNITY)
Admission: AD | Disposition: A | Discharge status: Hospice | Payer: Self-pay | Source: Other Acute Inpatient Hospital | Attending: Pulmonary Disease

## 2020-09-06 ENCOUNTER — Encounter (HOSPITAL_COMMUNITY): Payer: Self-pay | Admitting: Pulmonary Disease

## 2020-09-06 ENCOUNTER — Inpatient Hospital Stay (HOSPITAL_COMMUNITY)
Admission: AD | Admit: 2020-09-06 | Discharge: 2020-09-10 | DRG: 377 | Disposition: A | Discharge status: Hospice | Payer: Medicare Other | Source: Other Acute Inpatient Hospital | Attending: Pulmonary Disease | Admitting: Pulmonary Disease

## 2020-09-06 DIAGNOSIS — D62 Acute posthemorrhagic anemia: Secondary | ICD-10-CM | POA: Diagnosis present

## 2020-09-06 DIAGNOSIS — Z515 Encounter for palliative care: Secondary | ICD-10-CM

## 2020-09-06 DIAGNOSIS — G9341 Metabolic encephalopathy: Secondary | ICD-10-CM | POA: Diagnosis present

## 2020-09-06 DIAGNOSIS — Z978 Presence of other specified devices: Secondary | ICD-10-CM

## 2020-09-06 DIAGNOSIS — K920 Hematemesis: Secondary | ICD-10-CM

## 2020-09-06 DIAGNOSIS — Z79890 Hormone replacement therapy (postmenopausal): Secondary | ICD-10-CM | POA: Diagnosis not present

## 2020-09-06 DIAGNOSIS — K7460 Unspecified cirrhosis of liver: Secondary | ICD-10-CM | POA: Diagnosis present

## 2020-09-06 DIAGNOSIS — K449 Diaphragmatic hernia without obstruction or gangrene: Secondary | ICD-10-CM | POA: Diagnosis present

## 2020-09-06 DIAGNOSIS — E538 Deficiency of other specified B group vitamins: Secondary | ICD-10-CM | POA: Diagnosis present

## 2020-09-06 DIAGNOSIS — R578 Other shock: Secondary | ICD-10-CM | POA: Diagnosis present

## 2020-09-06 DIAGNOSIS — Z66 Do not resuscitate: Secondary | ICD-10-CM | POA: Diagnosis present

## 2020-09-06 DIAGNOSIS — K558 Other vascular disorders of intestine: Secondary | ICD-10-CM | POA: Diagnosis not present

## 2020-09-06 DIAGNOSIS — T4275XA Adverse effect of unspecified antiepileptic and sedative-hypnotic drugs, initial encounter: Secondary | ICD-10-CM | POA: Diagnosis present

## 2020-09-06 DIAGNOSIS — I8510 Secondary esophageal varices without bleeding: Secondary | ICD-10-CM | POA: Diagnosis present

## 2020-09-06 DIAGNOSIS — Z96652 Presence of left artificial knee joint: Secondary | ICD-10-CM | POA: Diagnosis present

## 2020-09-06 DIAGNOSIS — R0902 Hypoxemia: Secondary | ICD-10-CM

## 2020-09-06 DIAGNOSIS — J449 Chronic obstructive pulmonary disease, unspecified: Secondary | ICD-10-CM | POA: Diagnosis present

## 2020-09-06 DIAGNOSIS — I952 Hypotension due to drugs: Secondary | ICD-10-CM | POA: Diagnosis not present

## 2020-09-06 DIAGNOSIS — J690 Pneumonitis due to inhalation of food and vomit: Secondary | ICD-10-CM | POA: Diagnosis present

## 2020-09-06 DIAGNOSIS — E039 Hypothyroidism, unspecified: Secondary | ICD-10-CM | POA: Diagnosis present

## 2020-09-06 DIAGNOSIS — J9601 Acute respiratory failure with hypoxia: Secondary | ICD-10-CM | POA: Diagnosis not present

## 2020-09-06 DIAGNOSIS — Z79899 Other long term (current) drug therapy: Secondary | ICD-10-CM | POA: Diagnosis not present

## 2020-09-06 DIAGNOSIS — K31811 Angiodysplasia of stomach and duodenum with bleeding: Principal | ICD-10-CM | POA: Diagnosis present

## 2020-09-06 DIAGNOSIS — Z87891 Personal history of nicotine dependence: Secondary | ICD-10-CM

## 2020-09-06 DIAGNOSIS — K222 Esophageal obstruction: Secondary | ICD-10-CM | POA: Diagnosis present

## 2020-09-06 DIAGNOSIS — Z853 Personal history of malignant neoplasm of breast: Secondary | ICD-10-CM

## 2020-09-06 DIAGNOSIS — Z7189 Other specified counseling: Secondary | ICD-10-CM | POA: Diagnosis not present

## 2020-09-06 DIAGNOSIS — I69331 Monoplegia of upper limb following cerebral infarction affecting right dominant side: Secondary | ICD-10-CM

## 2020-09-06 DIAGNOSIS — K219 Gastro-esophageal reflux disease without esophagitis: Secondary | ICD-10-CM | POA: Diagnosis present

## 2020-09-06 DIAGNOSIS — K5520 Angiodysplasia of colon without hemorrhage: Secondary | ICD-10-CM

## 2020-09-06 DIAGNOSIS — D696 Thrombocytopenia, unspecified: Secondary | ICD-10-CM | POA: Diagnosis present

## 2020-09-06 DIAGNOSIS — K922 Gastrointestinal hemorrhage, unspecified: Secondary | ICD-10-CM | POA: Diagnosis present

## 2020-09-06 DIAGNOSIS — K7200 Acute and subacute hepatic failure without coma: Secondary | ICD-10-CM | POA: Diagnosis present

## 2020-09-06 DIAGNOSIS — E785 Hyperlipidemia, unspecified: Secondary | ICD-10-CM | POA: Diagnosis present

## 2020-09-06 DIAGNOSIS — Z452 Encounter for adjustment and management of vascular access device: Secondary | ICD-10-CM

## 2020-09-06 DIAGNOSIS — I10 Essential (primary) hypertension: Secondary | ICD-10-CM | POA: Diagnosis present

## 2020-09-06 DIAGNOSIS — Z0189 Encounter for other specified special examinations: Secondary | ICD-10-CM

## 2020-09-06 DIAGNOSIS — Z01818 Encounter for other preprocedural examination: Secondary | ICD-10-CM

## 2020-09-06 DIAGNOSIS — Z4659 Encounter for fitting and adjustment of other gastrointestinal appliance and device: Secondary | ICD-10-CM

## 2020-09-06 DIAGNOSIS — F419 Anxiety disorder, unspecified: Secondary | ICD-10-CM | POA: Diagnosis present

## 2020-09-06 DIAGNOSIS — E874 Mixed disorder of acid-base balance: Secondary | ICD-10-CM | POA: Diagnosis present

## 2020-09-06 HISTORY — PX: HOT HEMOSTASIS: SHX5433

## 2020-09-06 HISTORY — PX: HEMOSTASIS CLIP PLACEMENT: SHX6857

## 2020-09-06 HISTORY — PX: ESOPHAGOGASTRODUODENOSCOPY: SHX5428

## 2020-09-06 LAB — HEMOGLOBIN AND HEMATOCRIT, BLOOD
HCT: 21.5 % — ABNORMAL LOW (ref 36.0–46.0)
Hemoglobin: 7.5 g/dL — ABNORMAL LOW (ref 12.0–15.0)

## 2020-09-06 LAB — POCT I-STAT 7, (LYTES, BLD GAS, ICA,H+H)
Acid-base deficit: 7 mmol/L — ABNORMAL HIGH (ref 0.0–2.0)
Acid-base deficit: 7 mmol/L — ABNORMAL HIGH (ref 0.0–2.0)
Acid-base deficit: 7 mmol/L — ABNORMAL HIGH (ref 0.0–2.0)
Bicarbonate: 14 mmol/L — ABNORMAL LOW (ref 20.0–28.0)
Bicarbonate: 14.3 mmol/L — ABNORMAL LOW (ref 20.0–28.0)
Bicarbonate: 19.2 mmol/L — ABNORMAL LOW (ref 20.0–28.0)
Calcium, Ion: 1.22 mmol/L (ref 1.15–1.40)
Calcium, Ion: 1.26 mmol/L (ref 1.15–1.40)
Calcium, Ion: 1.31 mmol/L (ref 1.15–1.40)
HCT: 23 % — ABNORMAL LOW (ref 36.0–46.0)
HCT: 20 % — ABNORMAL LOW (ref 36.0–46.0)
HCT: 19 % — ABNORMAL LOW (ref 36.0–46.0)
Hemoglobin: 7.8 g/dL — ABNORMAL LOW (ref 12.0–15.0)
Hemoglobin: 6.8 g/dL — CL (ref 12.0–15.0)
Hemoglobin: 6.5 g/dL — CL (ref 12.0–15.0)
O2 Saturation: 98 %
O2 Saturation: 93 %
O2 Saturation: 100 %
Patient temperature: 99.4
Patient temperature: 100
Patient temperature: 99.3
Potassium: 3.4 mmol/L — ABNORMAL LOW (ref 3.5–5.1)
Potassium: 3.6 mmol/L (ref 3.5–5.1)
Potassium: 3.8 mmol/L (ref 3.5–5.1)
Sodium: 142 mmol/L (ref 135–145)
Sodium: 143 mmol/L (ref 135–145)
Sodium: 142 mmol/L (ref 135–145)
TCO2: 15 mmol/L — ABNORMAL LOW (ref 22–32)
TCO2: 15 mmol/L — ABNORMAL LOW (ref 22–32)
TCO2: 20 mmol/L — ABNORMAL LOW (ref 22–32)
pCO2 arterial: 15.5 mmHg — CL (ref 32.0–48.0)
pCO2 arterial: 17.4 mmHg — CL (ref 32.0–48.0)
pCO2 arterial: 40 mmHg (ref 32.0–48.0)
pH, Arterial: 7.567 — ABNORMAL HIGH (ref 7.350–7.450)
pH, Arterial: 7.526 — ABNORMAL HIGH (ref 7.350–7.450)
pH, Arterial: 7.291 — ABNORMAL LOW (ref 7.350–7.450)
pO2, Arterial: 82 mmHg — ABNORMAL LOW (ref 83.0–108.0)
pO2, Arterial: 60 mmHg — ABNORMAL LOW (ref 83.0–108.0)
pO2, Arterial: 386 mmHg — ABNORMAL HIGH (ref 83.0–108.0)

## 2020-09-06 LAB — COMPREHENSIVE METABOLIC PANEL
ALT: 187 U/L — ABNORMAL HIGH (ref 0–44)
AST: 181 U/L — ABNORMAL HIGH (ref 15–41)
Albumin: 2.5 g/dL — ABNORMAL LOW (ref 3.5–5.0)
Alkaline Phosphatase: 59 U/L (ref 38–126)
Anion gap: 7 (ref 5–15)
BUN: 51 mg/dL — ABNORMAL HIGH (ref 8–23)
CO2: 17 mmol/L — ABNORMAL LOW (ref 22–32)
Calcium: 8.5 mg/dL — ABNORMAL LOW (ref 8.9–10.3)
Chloride: 116 mmol/L — ABNORMAL HIGH (ref 98–111)
Creatinine, Ser: 0.77 mg/dL (ref 0.44–1.00)
GFR, Estimated: >60 mL/min (ref >60)
Glucose, Bld: 126 mg/dL — ABNORMAL HIGH (ref 70–99)
Potassium: 3.4 mmol/L — ABNORMAL LOW (ref 3.5–5.1)
Sodium: 140 mmol/L (ref 135–145)
Total Bilirubin: 1.9 mg/dL — ABNORMAL HIGH (ref 0.3–1.2)
Total Protein: 4.9 g/dL — ABNORMAL LOW (ref 6.5–8.1)

## 2020-09-06 LAB — CBC
HCT: 22.9 % — ABNORMAL LOW (ref 36.0–46.0)
HCT: 20.8 % — ABNORMAL LOW (ref 36.0–46.0)
Hemoglobin: 7.9 g/dL — ABNORMAL LOW (ref 12.0–15.0)
Hemoglobin: 7 g/dL — ABNORMAL LOW (ref 12.0–15.0)
MCH: 30 pg (ref 26.0–34.0)
MCH: 30.2 pg (ref 26.0–34.0)
MCHC: 34.5 g/dL (ref 30.0–36.0)
MCHC: 33.7 g/dL (ref 30.0–36.0)
MCV: 87.1 fL (ref 80.0–100.0)
MCV: 89.7 fL (ref 80.0–100.0)
Platelets: 154 10*3/uL (ref 150–400)
Platelets: 154 10*3/uL (ref 150–400)
RBC: 2.63 MIL/uL — ABNORMAL LOW (ref 3.87–5.11)
RBC: 2.32 MIL/uL — ABNORMAL LOW (ref 3.87–5.11)
RDW: 15.4 % (ref 11.5–15.5)
RDW: 15.8 % — ABNORMAL HIGH (ref 11.5–15.5)
WBC: 14.6 10*3/uL — ABNORMAL HIGH (ref 4.0–10.5)
WBC: 18.1 10*3/uL — ABNORMAL HIGH (ref 4.0–10.5)
nRBC: 0 % (ref 0.0–0.2)
nRBC: 0 % (ref 0.0–0.2)

## 2020-09-06 LAB — CBC WITH DIFFERENTIAL/PLATELET
Abs Immature Granulocytes: 0.08 10*3/uL — ABNORMAL HIGH (ref 0.00–0.07)
Basophils Absolute: 0 10*3/uL (ref 0.0–0.1)
Basophils Relative: 0 %
Eosinophils Absolute: 0 10*3/uL (ref 0.0–0.5)
Eosinophils Relative: 0 %
HCT: 22.4 % — ABNORMAL LOW (ref 36.0–46.0)
Hemoglobin: 7.7 g/dL — ABNORMAL LOW (ref 12.0–15.0)
Immature Granulocytes: 1 %
Lymphocytes Relative: 10 %
Lymphs Abs: 1.1 10*3/uL (ref 0.7–4.0)
MCH: 29.3 pg (ref 26.0–34.0)
MCHC: 34.4 g/dL (ref 30.0–36.0)
MCV: 85.2 fL (ref 80.0–100.0)
Monocytes Absolute: 0.7 10*3/uL (ref 0.1–1.0)
Monocytes Relative: 6 %
Neutro Abs: 9.7 10*3/uL — ABNORMAL HIGH (ref 1.7–7.7)
Neutrophils Relative %: 83 %
Platelets: 177 10*3/uL (ref 150–400)
RBC: 2.63 MIL/uL — ABNORMAL LOW (ref 3.87–5.11)
RDW: 15 % (ref 11.5–15.5)
WBC: 11.6 10*3/uL — ABNORMAL HIGH (ref 4.0–10.5)
nRBC: 0 % (ref 0.0–0.2)

## 2020-09-06 LAB — GLUCOSE, CAPILLARY
Glucose-Capillary: 126 mg/dL — ABNORMAL HIGH (ref 70–99)
Glucose-Capillary: 123 mg/dL — ABNORMAL HIGH (ref 70–99)
Glucose-Capillary: 154 mg/dL — ABNORMAL HIGH (ref 70–99)
Glucose-Capillary: 132 mg/dL — ABNORMAL HIGH (ref 70–99)
Glucose-Capillary: 131 mg/dL — ABNORMAL HIGH (ref 70–99)
Glucose-Capillary: 177 mg/dL — ABNORMAL HIGH (ref 70–99)

## 2020-09-06 LAB — MAGNESIUM: Magnesium: 1.7 mg/dL (ref 1.7–2.4)

## 2020-09-06 LAB — MRSA PCR SCREENING: MRSA by PCR: NEGATIVE

## 2020-09-06 LAB — LACTIC ACID, PLASMA
Lactic Acid, Venous: 1.3 mmol/L (ref 0.5–1.9)
Lactic Acid, Venous: 0.9 mmol/L (ref 0.5–1.9)
Lactic Acid, Venous: 2.4 mmol/L (ref 0.5–1.9)

## 2020-09-06 LAB — PREPARE RBC (CROSSMATCH)

## 2020-09-06 LAB — PROTIME-INR
INR: 1.2 (ref 0.8–1.2)
Prothrombin Time: 15.5 seconds — ABNORMAL HIGH (ref 11.4–15.2)

## 2020-09-06 LAB — ABO/RH: ABO/RH(D): AB POS

## 2020-09-06 SURGERY — EGD (ESOPHAGOGASTRODUODENOSCOPY)
Anesthesia: General

## 2020-09-06 MED ORDER — FENTANYL CITRATE (PF) 100 MCG/2ML IJ SOLN
INTRAMUSCULAR | Status: AC
  Filled 2020-09-06: qty 4

## 2020-09-06 MED ORDER — FENTANYL CITRATE (PF) 100 MCG/2ML IJ SOLN: 50.0000 ug | Freq: Once | INTRAMUSCULAR | Status: AC

## 2020-09-06 MED ORDER — LEVOTHYROXINE SODIUM 25 MCG PO TABS
50.0000 ug | ORAL_TABLET | Freq: Every day | ORAL | Status: DC
  Administered 2020-09-09: 50 ug
  Filled 2020-09-06: qty 2

## 2020-09-06 MED ORDER — PEG 3350-KCL-NA BICARB-NACL 420 G PO SOLR
4000.0000 mL | Freq: Once | ORAL | Status: AC
  Administered 2020-09-06: 4000 mL
  Filled 2020-09-06: qty 4000

## 2020-09-06 MED ORDER — NOREPINEPHRINE 4 MG/250ML-% IV SOLN
2.0000 ug/min | INTRAVENOUS | Status: DC
  Administered 2020-09-06: 6 ug/min via INTRAVENOUS
  Filled 2020-09-06: qty 250

## 2020-09-06 MED ORDER — MIDAZOLAM HCL 2 MG/2ML IJ SOLN
INTRAMUSCULAR | Status: AC
  Administered 2020-09-06: 1 mg via INTRAVENOUS
  Filled 2020-09-06: qty 2

## 2020-09-06 MED ORDER — PROPOFOL 1000 MG/100ML IV EMUL
5.0000 ug/kg/min | INTRAVENOUS | Status: DC
  Administered 2020-09-06: 15 ug/kg/min via INTRAVENOUS
  Administered 2020-09-06: 40 ug/kg/min via INTRAVENOUS
  Administered 2020-09-06: 35 ug/kg/min via INTRAVENOUS
  Administered 2020-09-07: 50 ug/kg/min via INTRAVENOUS
  Administered 2020-09-07: 40 ug/kg/min via INTRAVENOUS
  Administered 2020-09-07 (×2): 50 ug/kg/min via INTRAVENOUS
  Administered 2020-09-07: 45 ug/kg/min via INTRAVENOUS
  Administered 2020-09-08: 35 ug/kg/min via INTRAVENOUS
  Administered 2020-09-08: 45 ug/kg/min via INTRAVENOUS
  Administered 2020-09-08 (×2): 50 ug/kg/min via INTRAVENOUS
  Administered 2020-09-09: 40 ug/kg/min via INTRAVENOUS
  Filled 2020-09-06 (×13): qty 100

## 2020-09-06 MED ORDER — CHLORHEXIDINE GLUCONATE 0.12% ORAL RINSE (MEDLINE KIT)
15.0000 mL | Freq: Two times a day (BID) | OROMUCOSAL | Status: DC
  Administered 2020-09-06 – 2020-09-09 (×7): 15 mL via OROMUCOSAL

## 2020-09-06 MED ORDER — ETOMIDATE 2 MG/ML IV SOLN
INTRAVENOUS | Status: AC
  Administered 2020-09-06: 20 mg via INTRAVENOUS
  Filled 2020-09-06: qty 20

## 2020-09-06 MED ORDER — BUDESONIDE 0.25 MG/2ML IN SUSP
0.2500 mg | Freq: Two times a day (BID) | RESPIRATORY_TRACT | Status: DC
  Administered 2020-09-06 – 2020-09-10 (×9): 0.25 mg via RESPIRATORY_TRACT
  Filled 2020-09-06 (×8): qty 2

## 2020-09-06 MED ORDER — SODIUM CHLORIDE 0.9 % IV SOLN
8.0000 mg/h | INTRAVENOUS | Status: AC
  Administered 2020-09-06 – 2020-09-08 (×8): 8 mg/h via INTRAVENOUS
  Filled 2020-09-06 (×9): qty 80

## 2020-09-06 MED ORDER — VANCOMYCIN HCL 1000 MG/200ML IV SOLN: 1000.0000 mg | INTRAVENOUS | Status: DC

## 2020-09-06 MED ORDER — FENTANYL 2500MCG IN NS 250ML (10MCG/ML) PREMIX INFUSION
25.0000 ug/h | INTRAVENOUS | Status: DC
  Administered 2020-09-06: 25 ug/h via INTRAVENOUS
  Administered 2020-09-07: 100 ug/h via INTRAVENOUS
  Administered 2020-09-08: 75 ug/h via INTRAVENOUS
  Filled 2020-09-06 (×3): qty 250

## 2020-09-06 MED ORDER — MAGNESIUM SULFATE 2 GM/50ML IV SOLN
2.0000 g | Freq: Once | INTRAVENOUS | Status: AC
  Administered 2020-09-06: 2 g via INTRAVENOUS
  Filled 2020-09-06: qty 50

## 2020-09-06 MED ORDER — SODIUM CHLORIDE 0.9% IV SOLUTION: Freq: Once | INTRAVENOUS | Status: DC

## 2020-09-06 MED ORDER — LEVOTHYROXINE SODIUM 25 MCG PO TABS: 50.0000 ug | ORAL_TABLET | Freq: Every day | ORAL | Status: DC

## 2020-09-06 MED ORDER — POTASSIUM CHLORIDE 10 MEQ/100ML IV SOLN
10.0000 meq | INTRAVENOUS | Status: AC
  Administered 2020-09-06 (×2): 10 meq via INTRAVENOUS
  Filled 2020-09-06 (×2): qty 100

## 2020-09-06 MED ORDER — FENTANYL CITRATE (PF) 100 MCG/2ML IJ SOLN
25.0000 ug | INTRAMUSCULAR | Status: DC | PRN
  Administered 2020-09-06: 25 ug via INTRAVENOUS
  Administered 2020-09-07 – 2020-09-08 (×4): 50 ug via INTRAVENOUS

## 2020-09-06 MED ORDER — ROCURONIUM BROMIDE 10 MG/ML (PF) SYRINGE
PREFILLED_SYRINGE | INTRAVENOUS | Status: AC
  Administered 2020-09-06: 70 mg via INTRAVENOUS
  Filled 2020-09-06: qty 10

## 2020-09-06 MED ORDER — SODIUM CHLORIDE 0.9 % IV BOLUS
1000.0000 mL | Freq: Once | INTRAVENOUS | Status: AC
  Administered 2020-09-06: 1000 mL via INTRAVENOUS

## 2020-09-06 MED ORDER — SODIUM CHLORIDE 0.9% IV SOLUTION: Freq: Once | INTRAVENOUS | Status: AC

## 2020-09-06 MED ORDER — IOHEXOL 350 MG/ML SOLN
75.0000 mL | Freq: Once | INTRAVENOUS | Status: AC | PRN
  Administered 2020-09-06: 75 mL via INTRAVENOUS

## 2020-09-06 MED ORDER — KETAMINE HCL 50 MG/5ML IJ SOSY
PREFILLED_SYRINGE | INTRAMUSCULAR | Status: AC
  Filled 2020-09-06: qty 5

## 2020-09-06 MED ORDER — ETOMIDATE 2 MG/ML IV SOLN: 20.0000 mg | Freq: Once | INTRAVENOUS | Status: AC

## 2020-09-06 MED ORDER — SODIUM CHLORIDE 0.9 % IV SOLN: 50.0000 ug/h | INTRAVENOUS | Status: DC

## 2020-09-06 MED ORDER — PANTOPRAZOLE SODIUM 40 MG IV SOLR
40.0000 mg | Freq: Two times a day (BID) | INTRAVENOUS | Status: DC
  Administered 2020-09-09 – 2020-09-10 (×3): 40 mg via INTRAVENOUS
  Filled 2020-09-06 (×3): qty 40

## 2020-09-06 MED ORDER — MIDAZOLAM HCL (PF) 5 MG/ML IJ SOLN
INTRAMUSCULAR | Status: AC
  Filled 2020-09-06: qty 2

## 2020-09-06 MED ORDER — FENTANYL CITRATE (PF) 100 MCG/2ML IJ SOLN: 25.0000 ug | INTRAMUSCULAR | Status: DC | PRN

## 2020-09-06 MED ORDER — CHLORHEXIDINE GLUCONATE CLOTH 2 % EX PADS
6.0000 | MEDICATED_PAD | Freq: Every day | CUTANEOUS | Status: DC
  Administered 2020-09-06 – 2020-09-09 (×6): 6 via TOPICAL

## 2020-09-06 MED ORDER — ORAL CARE MOUTH RINSE
15.0000 mL | OROMUCOSAL | Status: DC
  Administered 2020-09-06 – 2020-09-09 (×32): 15 mL via OROMUCOSAL

## 2020-09-06 MED ORDER — FENTANYL CITRATE (PF) 100 MCG/2ML IJ SOLN
INTRAMUSCULAR | Status: AC
  Administered 2020-09-06: 50 ug via INTRAVENOUS
  Filled 2020-09-06: qty 2

## 2020-09-06 MED ORDER — NOREPINEPHRINE 4 MG/250ML-% IV SOLN
0.0000 ug/min | INTRAVENOUS | Status: DC
  Administered 2020-09-06: 2 ug/min via INTRAVENOUS
  Administered 2020-09-07: 5 ug/min via INTRAVENOUS
  Administered 2020-09-07: 8 ug/min via INTRAVENOUS
  Administered 2020-09-07: 9 ug/min via INTRAVENOUS
  Administered 2020-09-08: 8 ug/min via INTRAVENOUS
  Administered 2020-09-09: 4 ug/min via INTRAVENOUS
  Filled 2020-09-06 (×6): qty 250

## 2020-09-06 MED ORDER — FENTANYL CITRATE (PF) 100 MCG/2ML IJ SOLN
25.0000 ug | Freq: Once | INTRAMUSCULAR | Status: AC
  Administered 2020-09-06: 25 ug via INTRAVENOUS

## 2020-09-06 MED ORDER — HYDROMORPHONE HCL 1 MG/ML IJ SOLN
0.2000 mg | Freq: Once | INTRAMUSCULAR | Status: AC
  Administered 2020-09-06: 0.2 mg via INTRAVENOUS
  Filled 2020-09-06: qty 0.5

## 2020-09-06 MED ORDER — ARFORMOTEROL TARTRATE 15 MCG/2ML IN NEBU
15.0000 ug | INHALATION_SOLUTION | Freq: Two times a day (BID) | RESPIRATORY_TRACT | Status: DC
  Administered 2020-09-06 – 2020-09-10 (×9): 15 ug via RESPIRATORY_TRACT
  Filled 2020-09-06 (×9): qty 2

## 2020-09-06 MED ORDER — SODIUM CHLORIDE 0.9 % IV SOLN
250.0000 mL | INTRAVENOUS | Status: DC
  Administered 2020-09-06: 250 mL via INTRAVENOUS

## 2020-09-06 MED ORDER — MIDAZOLAM HCL 2 MG/2ML IJ SOLN: 1.0000 mg | Freq: Once | INTRAMUSCULAR | Status: AC

## 2020-09-06 MED ORDER — PIPERACILLIN-TAZOBACTAM 3.375 G IVPB
3.3750 g | Freq: Three times a day (TID) | INTRAVENOUS | Status: AC
  Administered 2020-09-06 – 2020-09-08 (×9): 3.375 g via INTRAVENOUS
  Filled 2020-09-06 (×9): qty 50

## 2020-09-06 MED ORDER — POLYETHYLENE GLYCOL 3350 17 G PO PACK: 17.0000 g | PACK | Freq: Every day | ORAL | Status: DC

## 2020-09-06 MED ORDER — FENTANYL BOLUS VIA INFUSION
25.0000 ug | INTRAVENOUS | Status: DC | PRN
  Administered 2020-09-06 – 2020-09-07 (×2): 50 ug via INTRAVENOUS
  Filled 2020-09-06: qty 100

## 2020-09-06 MED ORDER — SODIUM CHLORIDE 0.9 % IV SOLN
50.0000 ug/h | INTRAVENOUS | Status: DC
  Administered 2020-09-06: 50 ug/h via INTRAVENOUS
  Filled 2020-09-06 (×2): qty 1

## 2020-09-06 MED ORDER — SODIUM BICARBONATE 8.4 % IV SOLN
50.0000 meq | Freq: Once | INTRAVENOUS | Status: DC
  Filled 2020-09-06: qty 50

## 2020-09-06 MED ORDER — SODIUM CHLORIDE 0.9 % IV SOLN
50.0000 ug/h | INTRAVENOUS | Status: DC
  Administered 2020-09-06 – 2020-09-08 (×5): 50 ug/h via INTRAVENOUS
  Filled 2020-09-06 (×7): qty 1

## 2020-09-06 MED ORDER — OCTREOTIDE LOAD VIA INFUSION
50.0000 ug | Freq: Once | INTRAVENOUS | Status: DC
  Filled 2020-09-06 (×2): qty 25

## 2020-09-06 MED ORDER — IPRATROPIUM-ALBUTEROL 0.5-2.5 (3) MG/3ML IN SOLN
3.0000 mL | Freq: Four times a day (QID) | RESPIRATORY_TRACT | Status: DC
  Administered 2020-09-06 – 2020-09-09 (×14): 3 mL via RESPIRATORY_TRACT
  Filled 2020-09-06 (×14): qty 3

## 2020-09-06 MED ORDER — DOCUSATE SODIUM 50 MG/5ML PO LIQD
100.0000 mg | Freq: Two times a day (BID) | ORAL | Status: DC
  Administered 2020-09-06 – 2020-09-08 (×4): 100 mg
  Filled 2020-09-06 (×4): qty 10

## 2020-09-06 MED ORDER — ROCURONIUM BROMIDE 50 MG/5ML IV SOLN: 70.0000 mg | Freq: Once | INTRAVENOUS | Status: AC

## 2020-09-06 NOTE — Procedures (Signed)
Central Venous Catheter Insertion Procedure Note  Tammy Cortez  [id number]  [birth year]  Date:09/06/20  Time:8:36 AM   Provider Performing:Pete Johnette Abraham Kary Kos   Procedure: Insertion of Non-tunneled Central Venous [phone]) with US guidance ([id number])   Indication(s) Medication administration and Difficult access  Consent Risks of the procedure as well as the alternatives and risks of each were explained to the patient and/or caregiver.  Consent for the procedure was obtained and is signed in the bedside chart  Anesthesia Topical only with 1% lidocaine   Timeout Verified patient identification, verified procedure, site/side was marked, verified correct patient position, special equipment/implants available, medications/allergies/relevant history reviewed, required imaging and test results available.  Sterile Technique Maximal sterile technique including full sterile barrier drape, hand hygiene, sterile gown, sterile gloves, mask, hair covering, sterile ultrasound probe cover (if used).  Procedure Description Area of catheter insertion was cleaned with chlorhexidine and draped in sterile fashion.  With real-time ultrasound guidance a central venous catheter was placed into the right internal jugular vein. Nonpulsatile blood flow and easy flushing noted in all ports.  The catheter was sutured in place and sterile dressing applied.  Complications/Tolerance None; patient tolerated the procedure well. Chest X-ray is ordered to verify placement for internal jugular or subclavian cannulation.   Chest x-ray is not ordered for femoral cannulation. Attempted right [place] CVL was able get good blood flow but had difficulty w/ GW so aborted after 3 attempts.  EBL Minimal  Specimen(s) None  Erick Colace ACNP-BC [place] Pager # [phone] OR # [phone] if no answer

## 2020-09-06 NOTE — H&P (View-Only) (Signed)
[facility] Consult Note   History Tammy Cortez MRN # [id number]  Date of Admission: 09/06/2020 Date of Consultation: 09/06/2020 Referring physician: Dr. Loanne Drilling, Darlis Loan, MD Primary Care Provider: Raina Mina., MD Primary Gastroenterologist: None   Reason for Consultation/Chief Complaint: Hemorrhagic shock with hematemesis  Subjective  HPI:  This is a 75 year old woman transferred from [facility] overnight, where she presented with altered mental status and hypotension.  She was initially treated for suspected septic shock, then developed hematemesis, and NG tube had bright red blood output.  Her hemoglobin dropped significantly and she was transfused PRBCs and then transferred to [facility] for critical care management.  She has continued to put out bright red blood from the NG tube and passed melena.  Patient can provide no helpful history or review of systems right now since she is stuporous. I received a call from critical care attending about 6:30 AM notified me of the patient and requested consult.  My recommendation was to add octreotide bolus and drip, additional attempts to reach family and address goals of care.  If aggressive measures are warranted, prepare for intubation. I saw her in the ICU about 7:30 AM and spoke with Marni Griffon, NP of the critical care service.  He had the patient's husband Marcello Moores on the phone to update him and discussed the plan.  I also spoke with Marcello Moores on the phone, the patient is known to have history of cirrhosis from prior CT scan, and he was aware of that but said she had never seen a GI or liver doctor.  She has no previous history of upper GI bleeding to his recollection. His wife had a stroke about 2 months ago and has been debilitated since then. She is not known to be on any potent antiplatelet agents or anticoagulants.  ROS:  Unable to obtain as noted above   Past Medical History Past Medical History:   Diagnosis Date  . Anemia    iron deficiency  . Arthritis   . B12 deficiency   . COPD (chronic obstructive pulmonary disease) ([place])   . Esophageal stenosis   . GERD (gastroesophageal reflux disease)   . Heart murmur   . History of breast cancer   . History of colon polyps   . Hypercalcemia   . Hyperlipidemia   . Hypertension   . Liver cirrhosis ([place])   . Thyroid disease    hypothyroidism    Past Surgical History Past Surgical History:  Procedure Laterality Date  . BACK SURGERY     x2  . BREAST BIOPSY     x2  . HERNIA REPAIR    . 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  Social History Social History   Socioeconomic History  . Marital status: Married    Spouse name: Not on file  . Number of children: 3  . Years of education: Not on file  . Highest education level: Not on file  Occupational History  . Not on file  Tobacco Use  . Smoking status: Former Smoker    Packs/day: 0.50    Years: 30.00    Pack years: 15.00    Types: Cigarettes  . Smokeless tobacco: Never Used  Substance and Sexual Activity  . Alcohol use: Never  . Drug use: Never  . Sexual activity: Not Currently  Other Topics Concern  . Not on file  Social History Narrative  . Not on file   Social  Determinants of Health   Financial Resource Strain: Not on file  Food Insecurity: Not on file  Transportation Needs: Not on file  Physical Activity: Not on file  Stress: Not on file  Social Connections: Not on file    Allergies No Known Allergies  Outpatient Meds Home medications from the H+P and/or nursing med reconciliation reviewed.  Inpatient med list reviewed  _____________________________________________________________________ Objective   Exam:  Current vital signs  Patient Vitals for the past 8 hrs:  BP Temp Temp src Pulse Resp SpO2 Height Weight  09/06/20 0758 -- -- -- 81 16 100 % 5\' 2"  (1.575 m) --  09/06/20 0723 -- 99.3 F (37.4  C) Axillary -- -- -- -- --  09/06/20 0700 137/72 -- -- 89 (!) 29 94 % -- --  09/06/20 0600 121/63 -- -- 84 (!) 28 94 % -- --  09/06/20 0545 (!) 141/71 -- -- 98 (!) 21 93 % -- --  09/06/20 0530 135/66 -- -- 90 (!) 25 93 % -- --  09/06/20 0520 -- -- -- 98 (!) 21 94 % -- --  09/06/20 0515 (!) 152/79 -- -- 93 (!) 28 94 % -- --  09/06/20 0500 123/66 -- -- 94 (!) 27 96 % -- --  09/06/20 0445 135/67 -- -- 82 (!) 26 97 % -- --  09/06/20 0430 136/66 -- -- 85 (!) 27 93 % -- --  09/06/20 0415 127/75 -- -- 87 (!) 32 94 % -- --  09/06/20 0400 -- -- -- 93 (!) 40 96 % -- --  09/06/20 0345 -- -- -- 93 (!) 33 97 % -- --  09/06/20 0330 124/73 -- -- 86 (!) 26 95 % -- --  09/06/20 0319 -- 100 F (37.8 C) Axillary -- -- -- -- --  09/06/20 0315 127/75 -- -- 94 (!) 29 97 % -- --  09/06/20 0300 115/71 -- -- 98 (!) 33 95 % -- --  09/06/20 0245 133/75 -- -- 100 (!) 43 98 % -- --  09/06/20 0230 130/68 -- -- 92 (!) 32 96 % -- --  09/06/20 0228 -- -- -- -- -- 97 % -- --  09/06/20 0215 (!) 145/65 -- -- 93 (!) 29 98 % -- --  09/06/20 0200 (!) 153/81 -- -- 97 (!) 37 98 % -- 69.9 kg  09/06/20 0145 -- -- -- 95 (!) 28 98 % -- --  09/06/20 0130 -- -- -- 100 (!) 35 98 % -- --  09/06/20 0120 -- 99.4 F (37.4 C) Oral -- -- -- -- --    Intake/Output Summary (Last 24 hours) at 09/06/2020 0824 Last data filed at 09/06/2020 0700 Gross per 24 hour  Intake 305.92 ml  Output 751 ml  Net -445.08 ml    Physical Exam:  Chronically ill-appearing elderly woman, stuporous.  Tracks with eyes, reaching out and moaning.  Eyes: sclera anicteric, no redness  ENT: oral mucosa dry, NG tube in place  CV: RRR without murmur, S1/S2, no JVD,, no peripheral edema  Resp: Difficult to auscultate breath sounds over patient's moaning.  Tachypneic, not using accessory muscles.  GI: soft, no apparent tenderness, with active bowel sounds. No appreciable mass or hepatosplenomegaly  Skin; warm and dry, no rash or j aundice noted.   Pale  Labs:  CBC Latest Ref Rng & Units 09/06/2020 09/06/2020 09/06/2020  WBC 4.0 - 10.5 K/uL - - 11.6(H)  Hemoglobin 12.0 - 15.0 g/dL 7.5(L) 6.8(LL) 7.7(L)  Hematocrit 36.0 - 46.0 %  21.5(L) 20.0(L) 22.4(L)  Platelets 150 - 400 K/uL - - 177    CMP Latest Ref Rng & Units 09/06/2020 09/06/2020 09/06/2020  Glucose 70 - 99 mg/dL - 126(H) -  BUN 8 - 23 mg/dL - 51(H) -  Creatinine 0.44 - 1.00 mg/dL - 0.77 -  Sodium 135 - 145 mmol/L 143 140 142  Potassium 3.5 - 5.1 mmol/L 3.6 3.4(L) 3.4(L)  Chloride 98 - 111 mmol/L - 116(H) -  CO2 22 - 32 mmol/L - 17(L) -  Calcium 8.9 - 10.3 mg/dL - 8.5(L) -  Total Protein 6.5 - 8.1 g/dL - 4.9(L) -  Total Bilirubin 0.3 - 1.2 mg/dL - 1.9(H) -  Alkaline Phos 38 - 126 U/L - 59 -  AST 15 - 41 U/L - 181(H) -  ALT 0 - 44 U/L - 187(H) -    Recent Labs  Lab 09/06/20 0417  INR 1.2   _________________________________________________________ Radiologic studies:  Report from CT chest abdomen and pelvis 05/11/2020 reviewed. Medical history breast cancer and thoracic adenopathy  Stable 2.8 cm paratracheal lymph node, reportedly avid on previous PET scan.  Cirrhotic liver, no reported ascites, hiatal hernia  ______________________________________________________ Other studies:   _______________________________________________________ Assessment & Plan  Impression:  Hematemesis and melena Hemorrhagic shock Cirrhosis of unknown cause. Recent CVA with debility and altered mental status  Suspect variceal bleed, possible peptic ulcer.  Patient is having a brisk upper GI bleed with hemodynamic compromise and severe acute blood loss anemia despite transfusions of PRBC since arrival.  No thrombocytopenia or coagulopathy. Plan:  PCCM NP and I both spoke to the patient's husband Marcello Moores by phone.  While the patient does have a DNR which she would like to honor, Marcello Moores did want [place] to make efforts to identify and stop this bleeding if possible.  He was agreeable to  her being intubated for airway protection and stabilization and was agreeable to an upper endoscopy.  Risk and benefits were reviewed.  The benefits and risks of the planned procedure were described in detail with the patient or (when appropriate) their health care proxy.  Risks were outlined as including, but not limited to, bleeding, infection, perforation, adverse medication reaction leading to cardiac or pulmonary decompensation, pancreatitis (if ERCP).  The limitation of incomplete mucosal visualization was also discussed.  No guarantees or warranties were given. Consent witnessed by patient's ICU nurse.  Patient at increased risk for cardiopulmonary complications of procedure due to medical comorbidities.  Urgent procedure.  Patient has good IV access, octreotide and Protonix are running.  Additional PRBCs will be transfused.  No ascites on last CT scan, none apparent on exam, however patient is already on Zosyn because of the initially suspected sepsis.  Further plans to follow.  Thank you for the courtesy of this consult.  Please contact me with any questions or concerns.  Nelida Meuse III Office: [phone]

## 2020-09-06 NOTE — Progress Notes (Signed)
Pharmacy Antibiotic Note  Tammy Cortez is a 75 y.o. female admitted on 09/06/2020 with sepsis.  Pharmacy has been consulted for vancomycin and zosyn dosing.Pt was transferred from [place].  Antibiotics regimen there Zosyn 3.375 gm IV q8 hours (last dose 20:13) and vancomycin 1500 mg IV q24 hours (last dose 17:04)  Plan: Cont zosyn EI Continue vancomycin 1gm IV q24 hours F/u renal function, cultures and clinical course    Temp (24hrs), Avg:99.4 F (37.4 C), Min:99.4 F (37.4 C), Max:99.4 F (37.4 C)  No results for input(s): WBC, CREATININE, LATICACIDVEN, VANCOTROUGH, VANCOPEAK, VANCORANDOM, GENTTROUGH, GENTPEAK, GENTRANDOM, TOBRATROUGH, TOBRAPEAK, TOBRARND, AMIKACINPEAK, AMIKACINTROU, AMIKACIN in the last 168 hours.  CrCl cannot be calculated (Patient's most recent lab result is older than the maximum 21 days allowed.).    No Known Allergies   Thank you for allowing pharmacy to be a part of this patient's care.  Briel Gallicchio, [place] 09/06/2020 2:39 AM

## 2020-09-06 NOTE — Progress Notes (Signed)
NAME:  Tammy Cortez, MRN:  [id number], DOB:  [birth year], LOS: 0 ADMISSION DATE:  09/06/2020, CONSULTATION DATE:  09/05/20 REFERRING MD:  NA, CHIEF COMPLAINT:  GI bleed  History of Present Illness:   This is a 75 year old female with history of esophageal stenosis requiring dilation with Dr. Lyndel Safe, HTN, acquired hypothyroidism, COPD, cirrhosis of the liver, HTN,and breast cancer(with right paratracheal lymph node recently seen in 2022 that was pet avid but has not been biopsied as far as we can tell.) who presents from [facility]. She was initally admitted after a COPD exacerbation for presumed aspiration pneumonia and encephailits secondary to this. She was altered presumed to be from aspiration pna. She had a stroke 8 weeks prior to this with right sided deficits thereafter. She was becoming increasingly more septic requiring fluid bolus's and escalation of abx to vancomycin and Zosyn. Accompanying the drop in blood pressures patient had a drop in hgb from 8.6 to 5.0. Also noted to have dark tarry stools.  Shortly after these lab abnormalities were appreciated patient had a large volume hematemesis. Per transferring physicioan patietn has no history of GI bleed in past. She is not a blood thinner regularly. Her INR was 1.0. Given that there is not a GI service at [facility] it was decided to transfer patient to our ICU for for further evaluation of her GI bleed.     Had head CT that showed no acute intracranial pathology on 09/05/20  CT abdomen January 04/30/20 noting liver with cirrhotic morphology.    Pertinent  Medical History  HTN Hypothyroidism Breast cancer with hypermetabolic lymph node that has nbto biopsied yet esophageal stenosis requiring dilation in the past COPD  Significant Hospital Events: Including procedures, antibiotic start and stop dates in addition to other pertinent events   . Patietn admitted to the ICU on 09/04/20 On vanc and zosyn from OSH for aspiration PNA.  vanc and zosyn started  . 5/30 to ICU at [facility]. Octreotide gtt started. Intubated. CVL placed all for EGD. Vanc stopped  Interim History / Subjective:  Confused. BP stable   Objective   Blood pressure 137/72, pulse 89, temperature 99.3 F (37.4 C), temperature source Axillary, resp. rate (Abnormal) 29, weight 69.9 kg, SpO2 94 %.        Intake/Output Summary (Last 24 hours) at 09/06/2020 0726 Last data filed at 09/06/2020 0700 Gross per 24 hour  Intake 305.92 ml  Output 751 ml  Net -445.08 ml   Filed Weights   09/06/20 0200  Weight: 69.9 kg    Examination:  General this is a chronically ill appearing 75 year old female HENT sclera are pale. MM are pale Pulm scattered rhonchi no wheeze. No accessory use  Card systolic HM. HR 80s  Ext warm dependent edema scattered areas of ecchymosis  abd soft. Coffee gd appearing NG aspirate GU cl yellow Neuro sp slurred. RUE paralysis   Labs/imaging that I havepersonally reviewed  (right click and "Reselect all SmartList Selections" daily)    Resolved Hospital Problem list   NA  Assessment & Plan:  This is a 75 yo female with history as noted above who presents with GI bleed.    Acute Upper GI bleed.  No stigmatta of liver failure to suspect varices.  However patient did have CT scan in January that noted cirrhotic liver. -hgb holding ~7.7 to 7.8 Plan Cont protonix and octreotide gtt Intubated for airway protection Large bore CVL Upper endoscopy later today Keep 2 units blood ahead  Medication hypotension Plan Keep euvolemic Pressors as needed.  CVL  Mixed picture of metabolic acidosis and respiratory alkalosis  Plan Vol resuscitate  Vent per below  F/u abg  Aspiration PNA-Noted at OSH, SP stroke Plan Cont aspiration precautions Cont vanc and zosyn  abg post intubation No long term ventilation (was DNR-->see below)  H/o COPD {;am Cont brovana, pulmicort and duoneb  Hypothyroidism Plan Cont home  meds  History of stroke with paralysis of RUE Plan Swallow eval in future    H/o liver Cirrhosis Transaminits-Patient with AST of 79 at OSH and ALT of 45 here. Repeat AST of 18 and 187. Plan Can consider repeat CT abd at some point For now will repeat LFTs in am      Best Practice (right click and "Reselect all SmartList Selections" daily)   Diet:  NPO If oral type of diet: NPO Pain/Anxiety/Delirium protocol (if indicated): Yes (RASS goal -1) VAP protocol (if indicated): Yes DVT prophylaxis: Other (comment) SCDs  GI prophylaxis: PPI Glucose control:  SSI No  Central venous access:  Yes, and it is still needed Arterial line:  N/A Foley:  Yes, and it is still needed Restraints ACTION; IS/IS NOT: is not Restraint type: will evaluate daily Mobility:  bed rest  PT consulted: N/A Studies pending: EGD.  Culture data pending: respiratory culture  Last reviewed culture data:today Antibiotics: vancomycin and Zosyn Antibiotic de-escalation: Yes->stopped vanc Stop date: has will complete 5 total days from start date of 5/28   Daily labs: requested Code Status:  DNR Prognosis: Life-threating Last date of multidisciplinary goals of care discussion [I verified DNR with husband. Wants aggressive medical rx but no prolonged vent. No CPR]  Disposition: DNR      Critical care time: 32 min

## 2020-09-06 NOTE — Progress Notes (Signed)
Foley ordered. Has it from OSH.

## 2020-09-06 NOTE — Plan of Care (Signed)
  Problem: Education: Goal: Knowledge of General Education information will improve Description: Including pain rating scale, medication(s)/side effects and non-pharmacologic comfort measures Outcome: Progressing   Problem: Clinical Measurements: Goal: Ability to maintain clinical measurements within normal limits will improve Outcome: Progressing Goal: Will remain free from infection Outcome: Progressing Goal: Diagnostic test results will improve Outcome: Progressing Goal: Respiratory complications will improve Outcome: Progressing Goal: Cardiovascular complication will be avoided Outcome: Progressing   Problem: Activity: Goal: Risk for activity intolerance will decrease Outcome: Progressing   Problem: Coping: Goal: Level of anxiety will decrease Outcome: Progressing   Problem: Elimination: Goal: Will not experience complications related to bowel motility Outcome: Progressing Goal: Will not experience complications related to urinary retention Outcome: Progressing   Problem: Pain Managment: Goal: General experience of comfort will improve Outcome: Progressing   Problem: Safety: Goal: Ability to remain free from injury will improve Outcome: Progressing   Problem: Skin Integrity: Goal: Risk for impaired skin integrity will decrease Outcome: Progressing   

## 2020-09-06 NOTE — Consult Note (Signed)
[facility] Consult Note   History Tammy Cortez MRN # [id number]  Date of Admission: 09/06/2020 Date of Consultation: 09/06/2020 Referring physician: Dr. Loanne Drilling, Darlis Loan, MD Primary Care Provider: Raina Mina., MD Primary Gastroenterologist: None   Reason for Consultation/Chief Complaint: Hemorrhagic shock with hematemesis  Subjective  HPI:  This is a 75 year old woman transferred from [facility] overnight, where she presented with altered mental status and hypotension.  She was initially treated for suspected septic shock, then developed hematemesis, and NG tube had bright red blood output.  Her hemoglobin dropped significantly and she was transfused PRBCs and then transferred to [facility] for critical care management.  She has continued to put out bright red blood from the NG tube and passed melena.  Patient can provide no helpful history or review of systems right now since she is stuporous. I received a call from critical care attending about 6:30 AM notified me of the patient and requested consult.  My recommendation was to add octreotide bolus and drip, additional attempts to reach family and address goals of care.  If aggressive measures are warranted, prepare for intubation. I saw her in the ICU about 7:30 AM and spoke with Marni Griffon, NP of the critical care service.  He had the patient's husband Marcello Moores on the phone to update him and discussed the plan.  I also spoke with Marcello Moores on the phone, the patient is known to have history of cirrhosis from prior CT scan, and he was aware of that but said she had never seen a GI or liver doctor.  She has no previous history of upper GI bleeding to his recollection. His wife had a stroke about 2 months ago and has been debilitated since then. She is not known to be on any potent antiplatelet agents or anticoagulants.  ROS:  Unable to obtain as noted above   Past Medical History Past Medical History:   Diagnosis Date  . Anemia    iron deficiency  . Arthritis   . B12 deficiency   . COPD (chronic obstructive pulmonary disease) ([place])   . Esophageal stenosis   . GERD (gastroesophageal reflux disease)   . Heart murmur   . History of breast cancer   . History of colon polyps   . Hypercalcemia   . Hyperlipidemia   . Hypertension   . Liver cirrhosis ([place])   . Thyroid disease    hypothyroidism    Past Surgical History Past Surgical History:  Procedure Laterality Date  . BACK SURGERY     x2  . BREAST BIOPSY     x2  . HERNIA REPAIR    . 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  Social History Social History   Socioeconomic History  . Marital status: Married    Spouse name: Not on file  . Number of children: 3  . Years of education: Not on file  . Highest education level: Not on file  Occupational History  . Not on file  Tobacco Use  . Smoking status: Former Smoker    Packs/day: 0.50    Years: 30.00    Pack years: 15.00    Types: Cigarettes  . Smokeless tobacco: Never Used  Substance and Sexual Activity  . Alcohol use: Never  . Drug use: Never  . Sexual activity: Not Currently  Other Topics Concern  . Not on file  Social History Narrative  . Not on file   Social  Determinants of Health   Financial Resource Strain: Not on file  Food Insecurity: Not on file  Transportation Needs: Not on file  Physical Activity: Not on file  Stress: Not on file  Social Connections: Not on file    Allergies No Known Allergies  Outpatient Meds Home medications from the H+P and/or nursing med reconciliation reviewed.  Inpatient med list reviewed  _____________________________________________________________________ Objective   Exam:  Current vital signs  Patient Vitals for the past 8 hrs:  BP Temp Temp src Pulse Resp SpO2 Height Weight  09/06/20 0758 -- -- -- 81 16 100 % 5\' 2"  (1.575 m) --  09/06/20 0723 -- 99.3 F (37.4  C) Axillary -- -- -- -- --  09/06/20 0700 137/72 -- -- 89 (!) 29 94 % -- --  09/06/20 0600 121/63 -- -- 84 (!) 28 94 % -- --  09/06/20 0545 (!) 141/71 -- -- 98 (!) 21 93 % -- --  09/06/20 0530 135/66 -- -- 90 (!) 25 93 % -- --  09/06/20 0520 -- -- -- 98 (!) 21 94 % -- --  09/06/20 0515 (!) 152/79 -- -- 93 (!) 28 94 % -- --  09/06/20 0500 123/66 -- -- 94 (!) 27 96 % -- --  09/06/20 0445 135/67 -- -- 82 (!) 26 97 % -- --  09/06/20 0430 136/66 -- -- 85 (!) 27 93 % -- --  09/06/20 0415 127/75 -- -- 87 (!) 32 94 % -- --  09/06/20 0400 -- -- -- 93 (!) 40 96 % -- --  09/06/20 0345 -- -- -- 93 (!) 33 97 % -- --  09/06/20 0330 124/73 -- -- 86 (!) 26 95 % -- --  09/06/20 0319 -- 100 F (37.8 C) Axillary -- -- -- -- --  09/06/20 0315 127/75 -- -- 94 (!) 29 97 % -- --  09/06/20 0300 115/71 -- -- 98 (!) 33 95 % -- --  09/06/20 0245 133/75 -- -- 100 (!) 43 98 % -- --  09/06/20 0230 130/68 -- -- 92 (!) 32 96 % -- --  09/06/20 0228 -- -- -- -- -- 97 % -- --  09/06/20 0215 (!) 145/65 -- -- 93 (!) 29 98 % -- --  09/06/20 0200 (!) 153/81 -- -- 97 (!) 37 98 % -- 69.9 kg  09/06/20 0145 -- -- -- 95 (!) 28 98 % -- --  09/06/20 0130 -- -- -- 100 (!) 35 98 % -- --  09/06/20 0120 -- 99.4 F (37.4 C) Oral -- -- -- -- --    Intake/Output Summary (Last 24 hours) at 09/06/2020 0824 Last data filed at 09/06/2020 0700 Gross per 24 hour  Intake 305.92 ml  Output 751 ml  Net -445.08 ml    Physical Exam:  Chronically ill-appearing elderly woman, stuporous.  Tracks with eyes, reaching out and moaning.  Eyes: sclera anicteric, no redness  ENT: oral mucosa dry, NG tube in place  CV: RRR without murmur, S1/S2, no JVD,, no peripheral edema  Resp: Difficult to auscultate breath sounds over patient's moaning.  Tachypneic, not using accessory muscles.  GI: soft, no apparent tenderness, with active bowel sounds. No appreciable mass or hepatosplenomegaly  Skin; warm and dry, no rash or j aundice noted.   Pale  Labs:  CBC Latest Ref Rng & Units 09/06/2020 09/06/2020 09/06/2020  WBC 4.0 - 10.5 K/uL - - 11.6(H)  Hemoglobin 12.0 - 15.0 g/dL 7.5(L) 6.8(LL) 7.7(L)  Hematocrit 36.0 - 46.0 %  21.5(L) 20.0(L) 22.4(L)  Platelets 150 - 400 K/uL - - 177    CMP Latest Ref Rng & Units 09/06/2020 09/06/2020 09/06/2020  Glucose 70 - 99 mg/dL - 126(H) -  BUN 8 - 23 mg/dL - 51(H) -  Creatinine 0.44 - 1.00 mg/dL - 0.77 -  Sodium 135 - 145 mmol/L 143 140 142  Potassium 3.5 - 5.1 mmol/L 3.6 3.4(L) 3.4(L)  Chloride 98 - 111 mmol/L - 116(H) -  CO2 22 - 32 mmol/L - 17(L) -  Calcium 8.9 - 10.3 mg/dL - 8.5(L) -  Total Protein 6.5 - 8.1 g/dL - 4.9(L) -  Total Bilirubin 0.3 - 1.2 mg/dL - 1.9(H) -  Alkaline Phos 38 - 126 U/L - 59 -  AST 15 - 41 U/L - 181(H) -  ALT 0 - 44 U/L - 187(H) -    Recent Labs  Lab 09/06/20 0417  INR 1.2   _________________________________________________________ Radiologic studies:  Report from CT chest abdomen and pelvis 05/11/2020 reviewed. Medical history breast cancer and thoracic adenopathy  Stable 2.8 cm paratracheal lymph node, reportedly avid on previous PET scan.  Cirrhotic liver, no reported ascites, hiatal hernia  ______________________________________________________ Other studies:   _______________________________________________________ Assessment & Plan  Impression:  Hematemesis and melena Hemorrhagic shock Cirrhosis of unknown cause. Recent CVA with debility and altered mental status  Suspect variceal bleed, possible peptic ulcer.  Patient is having a brisk upper GI bleed with hemodynamic compromise and severe acute blood loss anemia despite transfusions of PRBC since arrival.  No thrombocytopenia or coagulopathy. Plan:  PCCM NP and I both spoke to the patient's husband Marcello Moores by phone.  While the patient does have a DNR which she would like to honor, Marcello Moores did want [place] to make efforts to identify and stop this bleeding if possible.  He was agreeable to  her being intubated for airway protection and stabilization and was agreeable to an upper endoscopy.  Risk and benefits were reviewed.  The benefits and risks of the planned procedure were described in detail with the patient or (when appropriate) their health care proxy.  Risks were outlined as including, but not limited to, bleeding, infection, perforation, adverse medication reaction leading to cardiac or pulmonary decompensation, pancreatitis (if ERCP).  The limitation of incomplete mucosal visualization was also discussed.  No guarantees or warranties were given. Consent witnessed by patient's ICU nurse.  Patient at increased risk for cardiopulmonary complications of procedure due to medical comorbidities.  Urgent procedure.  Patient has good IV access, octreotide and Protonix are running.  Additional PRBCs will be transfused.  No ascites on last CT scan, none apparent on exam, however patient is already on Zosyn because of the initially suspected sepsis.  Further plans to follow.  Thank you for the courtesy of this consult.  Please contact me with any questions or concerns.  Nelida Meuse III Office: [phone]

## 2020-09-06 NOTE — Progress Notes (Signed)
[facility] Progress Note Patient Name: Tammy Cortez DOB: 01-Nov-1945 MRN: [id number]   Date of Service  09/06/2020  HPI/Events of Note  ABG and AM labs reviewed.  Camera : Looking more comfortable.  Metabolic acidosis and resp alkalosis. Hg 6.8 on isat.    eICU Interventions  - get LA, not done that was ordered earlier Get repeat HG/Hct. If LA elevated, consider CT abdomen.  Asp precautions. - consider intubation if getting tired and oc2 rising. Ordered bicarb 50 meq for GI loss bicarb, so resp compensation eazes out.  - GI been paged from [facility] team . transfuse if Hg repeat <7. Discussed with RN. Overnight did had 250 ml blood/dark from NG tube. MAP and HR ok on re camera eval.       Intervention Category Intermediate Interventions: Other:  Elmer Sow 09/06/2020, 6:06 AM

## 2020-09-06 NOTE — Progress Notes (Signed)
No extravasation seen on CTA

## 2020-09-06 NOTE — Progress Notes (Signed)
Revaluated patient - she is passing copious melena and is intermittently hypotensive.(witnessed by me while nursing was cleaning patient.  CTA negative, but there is clearly still GI bleeding, source unknown. (? Small bowel or right colon)  Plan:  OGT Bowel prep Rectal tube Check frequent Hgb and give more blood  Probable colonoscopy +/- small bowel enteroscopy tomorrow  Patient will be signed out to overnight physician Carlean Purl) and new daytime consult doc for tomorrow Henrene Pastor)   - Wilfrid Lund, MD    [facility]

## 2020-09-06 NOTE — Plan of Care (Signed)

## 2020-09-06 NOTE — Progress Notes (Signed)
[facility] Progress Note Patient Name: Tammy Cortez DOB: May 31, 1945 MRN: [id number]   Date of Service  09/06/2020  HPI/Events of Note  Lactic Acid = 0.9 --> 2.4.  eICU Interventions  Will bolus with  0.9 NaCl 1 liter IV over 1 hour now.      Intervention Category Major Interventions: Acid-Base disturbance - evaluation and management  Tammy Cortez 09/06/2020, 8:06 PM

## 2020-09-06 NOTE — Progress Notes (Signed)
Full consult not to follow. Patient seen in ICU, spoke with PCCM attending and NP. Spoke with husband.  Brisk UGIB, prob variceal.  Getting octreotide, ETT and plans for EGD this morning.     - Wilfrid Lund, MD    [facility]

## 2020-09-06 NOTE — Procedures (Signed)
Intubation Procedure Note  Tammy Cortez  [id number]  1945/09/27  Date:09/06/20  Time:7:57 AM   Provider Performing:Lyriq Jarchow Rodman Pickle    Procedure: Intubation (31500)  Indication(s) Respiratory Failure  Consent Risks of the procedure as well as the alternatives and risks of each were explained to the patient and/or caregiver.  Consent for the procedure was obtained and is signed in the bedside chart   Anesthesia Etomidate, Versed and Fentanyl   Time Out Verified patient identification, verified procedure, site/side was marked, verified correct patient position, special equipment/implants available, medications/allergies/relevant history reviewed, required imaging and test results available.   Sterile Technique Usual hand hygeine, masks, and gloves were used   Procedure Description Patient positioned in bed supine.  Sedation given as noted above.  Patient was intubated with endotracheal tube using Glidescope.  View was Grade 1 full glottis .  Number of attempts was 1.  Colorimetric CO2 detector was consistent with tracheal placement.   Complications/Tolerance None; patient tolerated the procedure well. Chest X-ray is ordered to verify placement.   EBL Minimal   Specimen(s) None

## 2020-09-06 NOTE — Progress Notes (Signed)
Critical ABG results given to CCM.

## 2020-09-06 NOTE — Op Note (Addendum)
Procedure:   EGD with control bleeding  Meds:   Propofol  Indication:  Hematemesis and melena with ABLA  Findings:   Esophagus: grade 1 mid-esophageal varices, no active bleeding or stigmata   Stomach:  Retained black fluid and food.  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  [facility] Pager [phone]

## 2020-09-06 NOTE — Op Note (Signed)
[facility] Patient Name: Tammy Cortez Procedure Date : 09/06/2020 MRN: [id number] Attending MD: Estill Cotta. Loletha Carrow , MD Date of Birth: 04-Jul-1945 CSN: [id number] Age: 75 Admit Type: Inpatient Procedure:                Upper GI endoscopy Indications:              Acute post hemorrhagic anemia, Hematemesis Providers:                Mallie Mussel L. Loletha Carrow, MD, Doristine Johns, RN, Laverda Sorenson, Technician, Benay Pillow, RN, Tyna Jaksch Referring MD:             PCCM (ICU) Medicines:                Monitored Anesthesia Care (propofol on ventilator                            in ICU) Complications:            No immediate complications. Estimated Blood Loss:     Estimated blood loss was minimal. Procedure:                Pre-Anesthesia Assessment:                           - Prior to the procedure, a History and Physical                            was performed, and patient medications and                            allergies were reviewed. The patient's tolerance of                            previous anesthesia was also reviewed. The risks                            and benefits of the procedure and the sedation                            options and risks were discussed with the patient.                            All questions were answered, and informed consent                            was obtained. Prior Anticoagulants: The patient has                            taken no previous anticoagulant or antiplatelet                            agents. ASA Grade Assessment: IV - A patient with  severe systemic disease that is a constant threat                            to life. After reviewing the risks and benefits,                            the patient was deemed in satisfactory condition to                            undergo the procedure.                           After obtaining informed consent, the endoscope was                             passed under direct vision. Throughout the                            procedure, the patient's blood pressure, pulse, and                            oxygen saturations were monitored continuously. The                            GIF-H190 ([id number]) Olympus gastroscope was                            introduced through the mouth, and advanced to the                            second part of duodenum. The upper GI endoscopy was                            accomplished without difficulty. The patient                            tolerated the procedure well. Scope In: Scope Out: Findings:      Grade I varices were found in the middle third of the esophagus.      A 3 cm hiatal hernia was present.      A mild Schatzki ring was found in the lower third of the esophagus.      Hematin (altered blood/coffee-ground-like material) was found in the       entire examined stomach.      A single 3 mm angioectasia with bleeding was found on the lesser       curvature of the stomach. Coagulation for hemostasis using argon plasma       at 0.5 liters/minute and 20 watts was successful. To prevent bleeding       post-intervention, one hemostatic clip was successfully placed.      The exam of the stomach was otherwise normal, including on retroflexion       (other than mild pre-pylori NGT trauma). No gastric varices seen.       Visualization limited by retained food debris and black fluid.      A single diminutive  angioectasia without bleeding was found in the       duodenal bulb. Coagulation for hemostasis using argon plasma at 0.5       liters/minute and 20 watts was successful.      The exam of the duodenum was otherwise normal. Most notably, there was       no fresh or old blood in the entire duodenum.      Patient passing melena during exam.      IT IS NOT CLEAR THAT THESE FINDINGS EXPLAIN THE DEGREE OF BLEEDING AND       WORSENING ANEMIA. Impression:               - Grade I esophageal  varices.                           - 3 cm hiatal hernia.                           - Mild Schatzki ring.                           - Hematin (altered blood/coffee-ground-like                            material) in the entire stomach.                           - A single bleeding angioectasia in the stomach.                            Treated with argon plasma coagulation (APC). Clip                            was placed.                           - A single non-bleeding angioectasia in the                            duodenum. Treated with argon plasma coagulation                            (APC).                           - No specimens collected. Recommendation:           - NPO.                           - Discontinue octreotide drip                           Continue protonix drip                           Transfuse 1 unit PRBCs, then check Hgb/Hct every 4  hours.                           STAT abdominal CT Angiogram to rule out more distal                            small bowel or proximal colonic bleeding source. Procedure Code(s):        --- Professional ---                           [phone], Esophagogastroduodenoscopy, flexible,                            transoral; with control of bleeding, any method Diagnosis Code(s):        --- Professional ---                           I85.00, Esophageal varices without bleeding                           K44.9, Diaphragmatic hernia without obstruction or                            gangrene                           K22.2, Esophageal obstruction                           K92.2, Gastrointestinal hemorrhage, unspecified                           K31.811, Angiodysplasia of stomach and duodenum                            with bleeding                           D62, Acute posthemorrhagic anemia                           K92.0, Hematemesis CPT copyright 2019 American Medical Association. All rights reserved. The codes  documented in this report are preliminary and upon coder review may  be revised to meet current compliance requirements. Baya Lentz L. Loletha Carrow, MD 09/06/2020 10:15:53 AM This report has been signed electronically. Number of Addenda: 0

## 2020-09-06 NOTE — Progress Notes (Signed)
eLink Physician-Brief Progress Note Patient Name: Tammy Cortez DOB: May 01, 1945 MRN: [id number]   Date of Service  09/06/2020  HPI/Events of Note  Having dark stools. Hx of GI Bleed. Hgb =7.0.  eICU Interventions  Will transfuse 1 unit PRVC. Repeat CBC in AM.     Intervention Category Major Interventions: Other:  Lysle Dingwall 09/06/2020, 11:02 PM

## 2020-09-06 NOTE — Progress Notes (Signed)
[facility] Progress Note Patient Name: Tammy Cortez DOB: May 27, 1945 MRN: [id number]   Date of Service  09/06/2020  HPI/Events of Note  ABG resp alkalosis, overcompensated for her metabolic acidosis Off of levophed. Hg 7.8. follow up Hg pending.   Discussed. Asked RN to check belly, patient moans with pain.  K 3.4, cr normal.   eICU Interventions  - low dose dilaudid 0.2 mg once, Potassium 10 meq q1 hr x twice via PIV. NPO. - follow ABG at 6 AM Get a Lactate also, if elevated high, consider CT abdomen - has metabolic acidosis that is compensated.      Intervention Category Intermediate Interventions: Pain - evaluation and management;Respiratory distress - evaluation and management  Elmer Sow 09/06/2020, 4:29 AM

## 2020-09-06 NOTE — Interval H&P Note (Signed)
History and Physical Interval Note:  09/06/2020 8:57 AM  Tammy Cortez  has presented today for surgery, with the diagnosis of upper GI bleed.  The various methods of treatment have been discussed with the patient and family. After consideration of risks, benefits and other options for treatment, the patient has consented to  Procedure(s) with comments: ESOPHAGOGASTRODUODENOSCOPY (EGD) (N/A) - travel case in ICU as a surgical intervention.  The patient's history has been reviewed, patient examined, no change in status, stable for surgery.  I have reviewed the patient's chart and labs.  Questions were answered to the patient's satisfaction.     Nelida Meuse III

## 2020-09-06 NOTE — Progress Notes (Signed)
Critical ABG values reported to [place] [facility]. Ph 7.52  CO2 17.4 PO2 60 HCO3 14

## 2020-09-07 ENCOUNTER — Inpatient Hospital Stay: Payer: Self-pay

## 2020-09-07 DIAGNOSIS — K31811 Angiodysplasia of stomach and duodenum with bleeding: Principal | ICD-10-CM

## 2020-09-07 DIAGNOSIS — K921 Melena: Secondary | ICD-10-CM

## 2020-09-07 DIAGNOSIS — D62 Acute posthemorrhagic anemia: Secondary | ICD-10-CM

## 2020-09-07 DIAGNOSIS — K31819 Angiodysplasia of stomach and duodenum without bleeding: Secondary | ICD-10-CM

## 2020-09-07 LAB — COMPREHENSIVE METABOLIC PANEL
ALT: 159 U/L — ABNORMAL HIGH (ref 0–44)
AST: 84 U/L — ABNORMAL HIGH (ref 15–41)
Albumin: 2.1 g/dL — ABNORMAL LOW (ref 3.5–5.0)
Alkaline Phosphatase: 49 U/L (ref 38–126)
Anion gap: 8 (ref 5–15)
BUN: 32 mg/dL — ABNORMAL HIGH (ref 8–23)
CO2: 16 mmol/L — ABNORMAL LOW (ref 22–32)
Calcium: 8 mg/dL — ABNORMAL LOW (ref 8.9–10.3)
Chloride: 118 mmol/L — ABNORMAL HIGH (ref 98–111)
Creatinine, Ser: 0.75 mg/dL (ref 0.44–1.00)
GFR, Estimated: >60 mL/min (ref >60)
Glucose, Bld: 205 mg/dL — ABNORMAL HIGH (ref 70–99)
Potassium: 3.5 mmol/L (ref 3.5–5.1)
Sodium: 142 mmol/L (ref 135–145)
Total Bilirubin: 0.9 mg/dL (ref 0.3–1.2)
Total Protein: 4.3 g/dL — ABNORMAL LOW (ref 6.5–8.1)

## 2020-09-07 LAB — CBC
HCT: 25.2 % — ABNORMAL LOW (ref 36.0–46.0)
HCT: 25.6 % — ABNORMAL LOW (ref 36.0–46.0)
HCT: 25.3 % — ABNORMAL LOW (ref 36.0–46.0)
HCT: 26.1 % — ABNORMAL LOW (ref 36.0–46.0)
Hemoglobin: 8.5 g/dL — ABNORMAL LOW (ref 12.0–15.0)
Hemoglobin: 8.4 g/dL — ABNORMAL LOW (ref 12.0–15.0)
Hemoglobin: 8.4 g/dL — ABNORMAL LOW (ref 12.0–15.0)
Hemoglobin: 8.6 g/dL — ABNORMAL LOW (ref 12.0–15.0)
MCH: 30.1 pg (ref 26.0–34.0)
MCH: 29.5 pg (ref 26.0–34.0)
MCH: 30.1 pg (ref 26.0–34.0)
MCH: 29.9 pg (ref 26.0–34.0)
MCHC: 33.7 g/dL (ref 30.0–36.0)
MCHC: 32.8 g/dL (ref 30.0–36.0)
MCHC: 33.2 g/dL (ref 30.0–36.0)
MCHC: 33 g/dL (ref 30.0–36.0)
MCV: 89.4 fL (ref 80.0–100.0)
MCV: 89.8 fL (ref 80.0–100.0)
MCV: 90.7 fL (ref 80.0–100.0)
MCV: 90.6 fL (ref 80.0–100.0)
Platelets: 163 10*3/uL (ref 150–400)
Platelets: 145 10*3/uL — ABNORMAL LOW (ref 150–400)
Platelets: 147 10*3/uL — ABNORMAL LOW (ref 150–400)
Platelets: 163 10*3/uL (ref 150–400)
RBC: 2.82 MIL/uL — ABNORMAL LOW (ref 3.87–5.11)
RBC: 2.85 MIL/uL — ABNORMAL LOW (ref 3.87–5.11)
RBC: 2.79 MIL/uL — ABNORMAL LOW (ref 3.87–5.11)
RBC: 2.88 MIL/uL — ABNORMAL LOW (ref 3.87–5.11)
RDW: 15.7 % — ABNORMAL HIGH (ref 11.5–15.5)
RDW: 15.7 % — ABNORMAL HIGH (ref 11.5–15.5)
RDW: 15.9 % — ABNORMAL HIGH (ref 11.5–15.5)
RDW: 15.9 % — ABNORMAL HIGH (ref 11.5–15.5)
WBC: 16.6 10*3/uL — ABNORMAL HIGH (ref 4.0–10.5)
WBC: 14.4 10*3/uL — ABNORMAL HIGH (ref 4.0–10.5)
WBC: 15 10*3/uL — ABNORMAL HIGH (ref 4.0–10.5)
WBC: 14.4 10*3/uL — ABNORMAL HIGH (ref 4.0–10.5)
nRBC: 0 % (ref 0.0–0.2)
nRBC: 0 % (ref 0.0–0.2)
nRBC: 0 % (ref 0.0–0.2)
nRBC: 0 % (ref 0.0–0.2)

## 2020-09-07 LAB — GLUCOSE, CAPILLARY
Glucose-Capillary: 151 mg/dL — ABNORMAL HIGH (ref 70–99)
Glucose-Capillary: 185 mg/dL — ABNORMAL HIGH (ref 70–99)
Glucose-Capillary: 190 mg/dL — ABNORMAL HIGH (ref 70–99)
Glucose-Capillary: 165 mg/dL — ABNORMAL HIGH (ref 70–99)
Glucose-Capillary: 132 mg/dL — ABNORMAL HIGH (ref 70–99)
Glucose-Capillary: 129 mg/dL — ABNORMAL HIGH (ref 70–99)
Glucose-Capillary: 147 mg/dL — ABNORMAL HIGH (ref 70–99)

## 2020-09-07 LAB — TRIGLYCERIDES: Triglycerides: 158 mg/dL — ABNORMAL HIGH (ref <150)

## 2020-09-07 MED ORDER — INSULIN ASPART 100 UNIT/ML IJ SOLN
0.0000 [IU] | Freq: Three times a day (TID) | INTRAMUSCULAR | Status: DC
  Administered 2020-09-07: 3 [IU] via SUBCUTANEOUS

## 2020-09-07 MED ORDER — PEG-KCL-NACL-NASULF-NA ASC-C 100 G PO SOLR: 1.0000 | Freq: Once | ORAL | Status: DC

## 2020-09-07 MED ORDER — POTASSIUM CHLORIDE 20 MEQ PO PACK: 40.0000 meq | PACK | Freq: Once | ORAL | Status: DC

## 2020-09-07 MED ORDER — SODIUM CHLORIDE 0.9% FLUSH: 10.0000 mL | INTRAVENOUS | Status: DC | PRN

## 2020-09-07 MED ORDER — PEG-KCL-NACL-NASULF-NA ASC-C 100 G PO SOLR
0.5000 | Freq: Once | ORAL | Status: AC
  Administered 2020-09-08: 100 g

## 2020-09-07 MED ORDER — SODIUM CHLORIDE 0.9% FLUSH
10.0000 mL | Freq: Two times a day (BID) | INTRAVENOUS | Status: DC
  Administered 2020-09-07 – 2020-09-10 (×7): 10 mL

## 2020-09-07 MED ORDER — INSULIN ASPART 100 UNIT/ML IJ SOLN
0.0000 [IU] | INTRAMUSCULAR | Status: DC
  Administered 2020-09-07 – 2020-09-08 (×2): 2 [IU] via SUBCUTANEOUS

## 2020-09-07 MED ORDER — INSULIN ASPART 100 UNIT/ML IJ SOLN: 0.0000 [IU] | Freq: Every day | INTRAMUSCULAR | Status: DC

## 2020-09-07 MED ORDER — POTASSIUM CHLORIDE 10 MEQ/50ML IV SOLN
10.0000 meq | INTRAVENOUS | Status: AC
  Administered 2020-09-07 (×4): 10 meq via INTRAVENOUS
  Filled 2020-09-07 (×4): qty 50

## 2020-09-07 MED ORDER — PEG-KCL-NACL-NASULF-NA ASC-C 100 G PO SOLR
0.5000 | Freq: Once | ORAL | Status: AC
  Administered 2020-09-07: 100 g
  Filled 2020-09-07: qty 1

## 2020-09-07 NOTE — Progress Notes (Signed)
[facility] ADULT ICU REPLACEMENT PROTOCOL   The patient does apply for the [facility] Adult ICU Electrolyte Replacment Protocol based on the criteria listed below:   1. Is GFR >/= 30 ml/min? Yes.    Patient's GFR today is >60 2. Is SCr </= 2? Yes.   Patient's SCr is 0.75 ml/kg/hr 3. Did SCr increase >/= 0.5 in 24 hours? No. 4. Abnormal electrolyte(s):  K 3.5  5. Ordered repletion with: protocol 6. If a panic level lab has been reported, has the CCM MD in charge been notified? Yes.  .   Physician:  S. Rosie Fate R Agamjot Kilgallon 09/07/2020 5:31 AM

## 2020-09-07 NOTE — Progress Notes (Addendum)
Progress Note  Chief Complaint: GI bleed     ASSESSMENT / PLAN:    # 75 yo female COPD, recent CVA, and cirrhosis admitted with hemorrhagic shock. 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 Grade I esophageal varices without active bleeding or stigmata .  Small nonbleeding duodenal bulb AVM treated with APC. EGD findings felt not to explain the amount of bleeding.  Underwent CTA yesterday ( negative)  following the EGD. Plan was to prep for possible colonoscopy and enteroscopy today. --Received 2 units of PRBC since yesterday. Hgb improved 7.0 >> 8.5 this am following 2nd unit of blood at 1:30am. Another  hgb is pending.  --Patient was given bowel prep per RN. Rectal tube collection bag with black liquid stool. Will discuss with Dr. Henrene Pastor to see if he plans to proceed with SBE / colonoscopy today.  --Still on Octreotide at this point ( ? More distal small bowel varix)  --SBP in upper 90's, on Levophed  ADDENDUM:  Ongoing melenic stool will obscure endoscopic view of colon. Will give additional bowel prep today and reassess in am.     # Elevated liver enzymes, improving.  # Aspiration PNA noted at outside hospital --On Vancomycin and zosyn  # DNR    OBJECTIVE:    Scheduled inpatient medications:  . sodium chloride   Intravenous Once  . arformoterol  15 mcg Nebulization BID  . budesonide (PULMICORT) nebulizer solution  0.25 mg Nebulization BID  . chlorhexidine gluconate (MEDLINE KIT)  15 mL Mouth Rinse BID  . Chlorhexidine Gluconate Cloth  6 each Topical Q0600  . docusate  100 mg Per Tube BID  . ipratropium-albuterol  3 mL Nebulization Q6H  . levothyroxine  50 mcg Per Tube Q0600  . mouth rinse  15 mL Mouth Rinse 10 times per day  . octreotide  50 mcg Intravenous Once  . [START ON 09/09/2020] pantoprazole  40 mg Intravenous Q12H  . sodium chloride flush  10-40 mL Intracatheter Q12H   Continuous inpatient infusions:  . sodium chloride Stopped  (09/06/20 1928)  . fentaNYL infusion INTRAVENOUS 100 mcg/hr (09/07/20 0900)  . norepinephrine (LEVOPHED) Adult infusion 10 mcg/min (09/07/20 0800)  . octreotide  (SANDOSTATIN)    IV infusion 50 mcg/hr (09/07/20 0939)  . pantoprozole (PROTONIX) infusion 8 mg/hr (09/07/20 0800)  . piperacillin-tazobactam (ZOSYN)  IV Stopped (09/07/20 0715)  . potassium chloride 10 mEq (09/07/20 0856)  . propofol (DIPRIVAN) infusion 50 mcg/kg/min (09/07/20 0924)   PRN inpatient medications: fentaNYL, fentaNYL (SUBLIMAZE) injection, fentaNYL (SUBLIMAZE) injection, sodium chloride flush  Vital signs in last 24 hours: Temp:  [98.4 F (36.9 C)-98.9 F (37.2 C)] 98.4 F (36.9 C) (05/31 0722) Pulse Rate:  [58-96] 64 (05/31 0800) Resp:  [10-31] 20 (05/31 0800) BP: (73-149)/(43-87) 106/59 (05/31 0800) SpO2:  [87 %-100 %] 100 % (05/31 0800) FiO2 (%):  [40 %-50 %] 40 % (05/31 0800) Weight:  [72.3 kg] 72.3 kg (05/31 0410) Last BM Date: 09/07/20  Intake/Output Summary (Last 24 hours) at 09/07/2020 0946 Last data filed at 09/07/2020 0800 Gross per 24 hour  Intake 8150.51 ml  Output 1577 ml  Net 6573.51 ml     Physical Exam:  . General: opens eyes spontaneously. In NAD . Heart:  Regular rate, + murmur.  No lower extremity edema . Pulmonary: intubated on ventilator.  . Abdomen: Soft, nondistended, appears non-tender. Normal bowel sounds.    Filed Weights   09/06/20 0200 09/07/20 0410  Weight: 69.9 kg  72.3 kg    Intake/Output from previous day: 05/30 0701 - 05/31 0700 In: 8044.8 [I.V.:1800.7; Blood:733.5; NG/GT:4320; IV Piggyback:1190.5] Out: 9485 [Urine:800; Stool:852] Intake/Output this shift: Total I/O In: 139.6 [I.V.:101.8; IV Piggyback:37.7] Out: -     Lab Results: Recent Labs    09/06/20 1600 09/06/20 2200 09/07/20 0345  WBC 14.6* 18.1* 16.6*  HGB 7.9* 7.0* 8.5*  HCT 22.9* 20.8* 25.2*  PLT 154 154 163   BMET Recent Labs    09/06/20 0417 09/06/20 0554 09/06/20 0951  09/07/20 0345  NA 140 143 142 142  K 3.4* 3.6 3.8 3.5  CL 116*  --   --  118*  CO2 17*  --   --  16*  GLUCOSE 126*  --   --  205*  BUN 51*  --   --  32*  CREATININE 0.77  --   --  0.75  CALCIUM 8.5*  --   --  8.0*   LFT Recent Labs    09/07/20 0345  PROT 4.3*  ALBUMIN 2.1*  AST 84*  ALT 159*  ALKPHOS 49  BILITOT 0.9   PT/INR Recent Labs    09/06/20 0417  LABPROT 15.5*  INR 1.2   Hepatitis Panel No results for input(s): HEPBSAG, HCVAB, HEPAIGM, HEPBIGM in the last 72 hours.  DG Chest 1 View  Result Date: 09/06/2020 CLINICAL DATA:  Hypoxemia EXAM: CHEST  1 VIEW COMPARISON:  09/05/2020 FINDINGS: Single frontal view of the chest demonstrates a stable cardiac silhouette. Enteric catheter passes below diaphragm tip excluded by collimation. Persistent airspace disease within the right upper lobe and left lung base. No large effusion or pneumothorax. No acute bony abnormalities. IMPRESSION: 1. Persistent bilateral airspace disease consistent with pneumonia or aspiration. No significant change since prior study. Electronically Signed   By: Randa Ngo M.D.   On: 09/06/2020 02:31   DG Abd 1 View  Result Date: 09/06/2020 CLINICAL DATA:  75 year female status post enteric tube placement. EXAM: ABDOMEN - 1 VIEW COMPARISON:  CT abdomen pelvis dated 09/06/2020. FINDINGS: Enteric tube makes a loop in the left upper abdomen likely in the body of the stomach. The tip of the tube is likely in the distal body of the stomach. No bowel dilatation. A central venous line is partially visualized with tip close to the cavoatrial junction. The tip of the endotracheal tube is approximately 11 mm above the carina. Recommend retraction by 3 cm for optimal positioning. Small left pleural effusion and left lung base atelectasis. IMPRESSION: 1. Enteric tube with tip likely in the distal body of the stomach. 2. Endotracheal tube with tip 11 mm above the carina. Recommend retraction by 3 cm for  optimal positioning. Electronically Signed   By: Anner Crete M.D.   On: 09/06/2020 15:14   Portable Chest x-ray  Result Date: 09/06/2020 CLINICAL DATA:  Intubation. EXAM: PORTABLE CHEST 1 VIEW COMPARISON:  09/06/2020 at 1:55 a.m. and older exams. FINDINGS: Endotracheal tube tip projects 2.5 cm above the carina. Nasal/orogastric tube passes below the diaphragm, well into the stomach, stable. New right internal jugular dual lumen central venous catheter, tip projecting in the lower superior vena cava. Lungs are hyperexpanded. Patchy areas of hazy airspace opacity are stable with additional opacity in the lung bases, greater on the left, consistent with atelectasis. No new lung abnormalities. No pneumothorax. IMPRESSION: 1. Well-positioned endotracheal tube. 2. Well-positioned right internal jugular dual lumen central venous catheter. No pneumothorax. 3. No other change from the earlier exam. Electronically Signed  By: Lajean Manes M.D.   On: 09/06/2020 09:06   [place] EKG SITE RITE  Result Date: 09/06/2020 If Site Rite image not attached, placement could not be confirmed due to current cardiac rhythm.  CT Angio Abd/Pel w/ and/or w/o  Result Date: 09/06/2020 CLINICAL DATA:  Melena and anemia.  Concern for GI bleeding. EXAM: CTA ABDOMEN AND PELVIS WITHOUT AND WITH CONTRAST TECHNIQUE: Multidetector CT imaging of the abdomen and pelvis was performed using the standard protocol during bolus administration of intravenous contrast. Multiplanar reconstructed images and MIPs were obtained and reviewed to evaluate the vascular anatomy. CONTRAST:  9m OMNIPAQUE IOHEXOL 350 MG/ML SOLN COMPARISON:  CT the chest, abdomen pelvis-05/11/2020;; 03/24/2015; abdominal MRI-09/28/2018. FINDINGS: VASCULAR Aorta: There is a moderate amount of calcified atherosclerotic plaque within a normal caliber abdominal aorta, not resulting in a hemodynamically significant stenosis. No evidence of abdominal aortic dissection or periaortic  stranding. Celiac: There is a minimal amount of eccentric calcified atherosclerotic plaque involving the origin the celiac artery, not resulting in a hemodynamically significant stenosis. Conventional branching pattern. 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 SMA: There is a moderate amount of eccentric mixed calcified and noncalcified atherosclerotic plaque involving the origin and proximal aspect of the SMA which approaches 50% luminal narrowing (representative sagittal image 97, series 9). Conventional branching pattern. The distal tributaries the SMA appear widely patent without discrete internal filling defect to suggest distal embolism. Renals: Solitary bilaterally; there is a minimal amount of eccentric calcified atherosclerotic plaque involving the origin of the bilateral renal arteries, not resulting in a hemodynamically significant stenosis. No vessel irregularity to suggest FMD. IMA: Diseased at its origin though remains patent. Inflow: There is a moderate amount of eccentric prominently calcified atherosclerotic plaque involving the bilateral common and external iliac arteries, not definitely resulting in a hemodynamically significant stenosis. The bilateral internal iliac arteries are diseased though patent and of normal caliber. Proximal Outflow: There is a moderate amount of eccentric calcified atherosclerotic plaque involving the distal aspect of the right common femoral artery which approaches 50% luminal narrowing (image 131, series 6). There is a minimal amount of eccentric predominantly calcified atherosclerotic plaque involving the left common femoral artery, not resulting in a hemodynamically significant stenosis. The bilateral deep and superficial femoral arteries are of normal  caliber and widely patent throughout their imaged courses. Veins: The IVC and pelvic venous systems appear widely patent. Review of the MIP images confirms the above findings. _________________________________________________________ _________________________________________________________ NON-VASCULAR Lower chest: Limited visualization of the lower thorax demonstrates trace bilateral pleural effusions with associated subpleural consolidative opacities, left greater than right. Coronary artery calcifications. Calcifications within the mitral valve annulus. There is persistent deviation of the heart into the left hemithorax, similar to the 05/11/2020 examination. No pericardial effusion. Hepatobiliary: Mild nodularity hepatic contour. There is a punctate (approximately 0.7 cm) hypoattenuating lesion within the medial segment of the left lobe of the liver previously characterized as a hepatic cyst on 09/28/2018. No discrete worrisome hepatic lesions. There is a punctate stone lying dependently within an otherwise normal-appearing gallbladder. No gallbladder wall thickening or pericholecystic stranding. No ascites. Pancreas: Normal appearance of the pancreas. Spleen: Normal appearance of the spleen. Adrenals/Urinary Tract: There is symmetric enhancement of the bilateral kidneys. Note is made of left-sided parapelvic cysts. No discrete right-sided renal lesions. No urine obstruction or perinephric  stranding. Normal appearance of the bilateral adrenal glands. The urinary bladder is decompressed with a Foley catheter. Stomach/Bowel: There are no discrete areas of intraluminal contrast extravasation to suggest the etiology of reported history of GI bleeding. Two radiopaque pill fragments are seen within the gastric fundus. There is apparent mild-to-moderate circumferential colonic wall thickening without evidence of enteric obstruction. Rather extensive diverticulosis primarily involving the sigmoid colon. Moderate stool  burden, most conspicuous within the rectum. Surgical clips about the gastroesophageal junction with persistent moderate-sized hiatal hernia. No pneumoperitoneum, pneumatosis or portal venous gas. Lymphatic: No bulky retroperitoneal, mesenteric, pelvic or inguinal lymph adenopathy. Reproductive: Normal appearance of the pelvic organs. No free fluid in the pelvic cul-de-sac. Other: Diffuse body wall anasarca. Musculoskeletal: No acute or aggressive osseous abnormalities. Post L4-S1 paraspinal fusion and intervertebral disc space replacement. Unchanged malpositioning of the right L4 pedicular screw extending to involve the right lateral aspect of the L3-L4 articulation, similar to remote abdominal CT performed 09/26/2019. redemonstrated moderate severe DDD within the remainder of the lumbar spine, worse at L3-L4 with disc space height loss, endplate irregularity and sclerosis. Stigmata of dish within the lower thoracic spine. Degenerative change the pubic symphysis. Mild degenerative change the bilateral hips with joint space loss, subchondral sclerosis and osteophytosis. IMPRESSION: VASCULAR 1. No discrete areas of enteric intraluminal contrast extravasation to suggest etiology of reported history of lower GI bleeding. 2. Moderate amount of atherosclerotic plaque within normal caliber abdominal aorta. Aortic Atherosclerosis (ICD10-I70.0). 3. Suspected hemodynamically significant narrowing involving the origin and proximal aspect of the SMA. The celiac and IMA both remain patent. 4. Suspected hemodynamically significant narrowing involving the distal aspect of right common femoral artery. Correlation for symptoms of right lower extremity PAD is advised. NON-VASCULAR 1. Apparent diffuse colonic wall thickening without evidence of enteric obstruction, nonspecific though suggestive of an enteritis, typically of infectious or inflammatory etiology. 2. Rather extensive colonic diverticulosis, most conspicuously involving  the sigmoid colon, without evidence of superimposed acute diverticulitis. 3. Moderate colonic stool burden, particularly within the rectal vault. 4. Stigmata of cirrhosis.  Correlation with LFTs is advised. 5. Cholelithiasis without evidence of cholecystitis. 6. Surgical clips about the GE junction with persistent moderate-sized hiatal hernia. 7. Trace bilateral effusions with associated bibasilar opacities, atelectasis versus infiltrate. Electronically Signed   By: Sandi Mariscal M.D.   On: 09/06/2020 11:31      Active Problems:   GI bleed     LOS: 1 day   Tye Savoy ,NP 09/07/2020, 9:46 AM  GI ATTENDING  Interval history and data reviewed.  Initial portion of hospitalization presented to me by Dr. Loletha Carrow.  Agree with interval progress note.  I have reviewed laboratories, x-rays, and endoscopic findings as well as images.  This patient has multiple significant medical problems.  She has had good response to transfusions with hemoglobin now 8.4.  I do not suspect that her shock was entirely related to acute GI bleeding.  The preponderance of the evidence points to upper GI bleeding (hematemesis, dark stools, elevated BUN, and actively bleeding gastric AVM, nonbleeding AVM in the duodenum on endoscopy).  With gut lavage, dark stools ongoing.  I agree with ongoing close observation and transfusion as needed.  If GI bleeding persists I would pursue repeat upper endoscopy/enteroscopy as the next modality (anticipating the need for repeat therapy).  We will continue to follow.  Docia Chuck. Geri Seminole., M.D. [facility] Division of Gastroenterology

## 2020-09-07 NOTE — Progress Notes (Signed)
NAME:  Tammy Cortez, MRN:  [id number], DOB:  1945/07/14, LOS: 1 ADMISSION DATE:  09/06/2020, CONSULTATION DATE:  09/05/20 REFERRING MD:  NA, CHIEF COMPLAINT:  GI bleed  History of Present Illness:   This is a 75 year old female with history of esophageal stenosis requiring dilation with Dr. Lyndel Safe, HTN, acquired hypothyroidism, COPD, cirrhosis of the liver, HTN,and breast cancer(with right paratracheal lymph node recently seen in 2022 that was pet avid but has not been biopsied as far as we can tell.) who presents from [facility]. She was initally admitted after a COPD exacerbation for presumed aspiration pneumonia and encephailits secondary to this. She was altered presumed to be from aspiration pna. She had a stroke 8 weeks prior to this with right sided deficits thereafter. She was becoming increasingly more septic requiring fluid bolus's and escalation of abx to vancomycin and Zosyn. Accompanying the drop in blood pressures patient had a drop in hgb from 8.6 to 5.0. Also noted to have dark tarry stools.  Shortly after these lab abnormalities were appreciated patient had a large volume hematemesis. Per transferring physicioan patietn has no history of GI bleed in past. She is not a blood thinner regularly. Her INR was 1.0. Given that there is not a GI service at [facility] it was decided to transfer patient to our ICU for for further evaluation of her GI bleed.   Had head CT that showed no acute intracranial pathology on 09/05/20  CT abdomen January 04/30/20 noting liver with cirrhotic morphology.    Pertinent  Medical History  HTN Hypothyroidism Breast cancer with hypermetabolic lymph node that has nbto biopsied yet esophageal stenosis requiring dilation in the past COPD  Significant Hospital Events: Including procedures, antibiotic start and stop dates in addition to other pertinent events   . Patietn admitted to the ICU on 09/04/20 On vanc and zosyn from OSH for aspiration PNA. vanc  and zosyn started  . 5/30 to ICU at [facility]. Octreotide gtt started. Intubated. CVL placed all for EGD. Vanc stopped . S/p EGD with AVM discovered  Interim History / Subjective:  Intubated, on ventilator Sedated No overnight events She is arousable  Objective   Blood pressure (!) 106/59, pulse 64, temperature 98.4 F (36.9 C), temperature source Axillary, resp. rate 20, height 5\' 2"  (1.575 m), weight 72.3 kg, SpO2 100 %. CVP:  [3 mmHg-9 mmHg] 9 mmHg  Vent Mode: PRVC FiO2 (%):  [40 %-100 %] 40 % Set Rate:  [18 bmp-20 bmp] 20 bmp Vt Set:  [400 mL] 400 mL PEEP:  [5 cmH20] 5 cmH20 Plateau Pressure:  [13 cmH20-14 cmH20] 13 cmH20   Intake/Output Summary (Last 24 hours) at 09/07/2020 0859 Last data filed at 09/07/2020 0800 Gross per 24 hour  Intake 8167.8 ml  Output 1577 ml  Net 6590.8 ml   Filed Weights   09/06/20 0200 09/07/20 0410  Weight: 69.9 kg 72.3 kg    Examination:  Chronically ill-appearing HENT: Moist oral mucosa, anicteric Pulmonary: Decreased air movement mild rhonchi appreciated Cardiac: S1-S2 appreciated Abdomen: Bowel sounds appreciated GU cl yellow Neuro: Sedated  Labs/imaging that I havepersonally reviewed  (right click and "Reselect all SmartList Selections" daily)    Resolved Hospital Problem list   NA  Assessment & Plan:  This is a 75 yo female with history as noted above who presents with GI bleed.   Acute GI bleed -AVM found on endoscopy-treated with a clear APC -Noted to still be having large amounts of melanotic stools -Concern for ongoing  bleeding -For possible enteroscopy and colonoscopy -GI to follow -Continue on Protonix and octreotide  Hypotension -Stable  Metabolic acidosis -Multifactorial  Aspiration pneumonia -On Zosyn -Completed vancomycin -Continue ventilator support  History of chronic obstructive pulmonary disease -Continue bronchodilators including Brovana, Pulmicort, DuoNeb  Hypothyroidism -On Synthroid  History  of CVA -right upper extremity weakness  History of liver cirrhosis Transaminitis -Monitor LFTs  Will follow-up with GI for planned evaluation  Best Practice (right click and "Reselect all SmartList Selections" daily)   Diet:  NPO If oral type of diet: NPO Pain/Anxiety/Delirium protocol (if indicated): Yes (RASS goal -1) VAP protocol (if indicated): Yes DVT prophylaxis: Other (comment) SCDs  GI prophylaxis: PPI Glucose control:  SSI No  Central venous access:  Yes, and it is still needed Arterial line:  N/A Foley:  Yes, and it is still needed Restraints ACTION; IS/IS NOT: is not Restraint type: will evaluate daily Mobility:  bed rest  PT consulted: N/A Studies pending: Enteroscopy and possible colonoscopy Culture data pending: respiratory culture  Last reviewed culture data:today Antibiotics: Zosyn Antibiotic de-escalation: Continue Zosyn at present Stop date: has will complete 5 total days from start date of 5/28   Daily labs: requested Code Status:  DNR Prognosis: Life-threating Last date of multidisciplinary goals of care discussion [I verified DNR with husband. Wants aggressive medical rx but no prolonged vent. No CPR]  Disposition: DNR  The patient is critically ill with multiple organ systems failure and requires high complexity decision making for assessment and support, frequent evaluation and titration of therapies, application of advanced monitoring technologies and extensive interpretation of multiple databases. Critical Care Time devoted to patient care services described in this note independent of APP/resident time (if applicable)  is 33 minutes.   Sherrilyn Rist MD [facility] Personal pager: [phone] If unanswered, please page CCM On-call: [phone]

## 2020-09-07 NOTE — Progress Notes (Signed)
NAME:  Tammy Cortez, MRN:  [id number], DOB:  [birth year], LOS: 1 ADMISSION DATE:  09/06/2020, CONSULTATION DATE:  09/05/20 REFERRING MD:  NA, CHIEF COMPLAINT:  GI bleed  History of Present Illness:   This is a 75 year old female with history of esophageal stenosis requiring dilation with Dr. Lyndel Safe, HTN, acquired hypothyroidism, COPD, cirrhosis of the liver, HTN,and breast cancer(with right paratracheal lymph node recently seen in 2022 that was pet avid but has not been biopsied as far as we can tell.) who presents from [facility]. She was initally admitted after a COPD exacerbation for presumed aspiration pneumonia and encephailits secondary to this. She was altered presumed to be from aspiration pna. She had a stroke 8 weeks prior to this with right sided deficits thereafter. She was becoming increasingly more septic requiring fluid bolus's and escalation of abx to vancomycin and Zosyn. Accompanying the drop in blood pressures patient had a drop in hgb from 8.6 to 5.0. Also noted to have dark tarry stools.  Shortly after these lab abnormalities were appreciated patient had a large volume hematemesis. Per transferring physicioan patient has no history of GI bleed in past. She is not a blood thinner regularly. Her INR was 1.0. Given that there is not a GI service at [facility] it was decided to transfer patient to our ICU for for further evaluation of her GI bleed.    -Had head CT that showed no acute intracranial pathology on 09/05/20.  -CT abdomen January 04/30/20 noting liver with cirrhotic morphology.  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     Pertinent  Medical History  HTN Hypothyroidism Breast cancer with hypermetabolic lymph node that has nbto biopsied yet esophageal stenosis requiring dilation x2 in the  past COPD Stroke with RUE paralysis (recent)  Significant Hospital Events: Including procedures, antibiotic start and stop dates in addition to other pertinent events   . Patient admitted to the ICU on 09/04/20 On vanc and zosyn from OSH for aspiration PNA. Vanc and zosyn started  . 5/30 to ICU at [facility]. Octreotide gtt started. Intubated. CVL placed all for EGD. Vanc stopped  Interim History / Subjective:  Patient agitated this morning, trying to sit up during exam.  Afebrile overnight, hypotensive on norepi, at 10 mcg, mild improvement in MAPs.  She received blood overnight, still passing copious melena.  She was also given a NS bolus overnight due to elevated lactate at 2.4.  Has been on 40% FiO2 and PEEP of 5 overnight.  Possible colonoscopy +/- small bowel enteroscopy today.    Objective   Blood pressure (!) 106/59, pulse 64, temperature 98.4 F (36.9 C), temperature source Axillary, resp. rate 20, height 5\' 2"  (1.575 m), weight 72.3 kg, SpO2 100 %. CVP:  [3 mmHg-6 mmHg] 6 mmHg  Vent Mode: PRVC FiO2 (%):  [40 %-100 %] 40 % Set Rate:  [18 bmp-20 bmp] 20 bmp Vt Set:  [400 mL] 400 mL PEEP:  [5 cmH20] 5 cmH20 Plateau Pressure:  [13 cmH20-14 cmH20] 13 cmH20   Intake/Output Summary (Last 24 hours) at 09/07/2020 0850 Last data filed at 09/07/2020 0800 Gross per 24 hour  Intake 8167.8 ml  Output 1577 ml  Net 6590.8 ml   Filed Weights   09/06/20 0200 09/07/20 0410  Weight: 69.9 kg 72.3 kg    Physical Examination: General: this is a chronically  ill appearing 75 year old female HENT: sclera are pale. MM are pale Pulm: Diffuse rhonchi, normal WOB  CV: 3/6 systolic HM, RRR Ext: warm dependent edema scattered areas of ecchymosis  Abd: soft, ND, coffee gd appearing stool in rectal bag GU cl yellow Neuro: still with light sedation, disoriented, unable to follow commands, arouses to verbal and tactile stimuli   Labs/imaging that I havepersonally reviewed  (right click and "Reselect all  SmartList Selections" daily)  CMP, CBC, results of EGD, CTA   Resolved Hospital Problem list   NA  Assessment & Plan:  This is a 75 yo female with history as noted above who presents with GI bleed.    Acute Upper GI bleed.  No stigmatta of liver failure to suspect varices.  However patient did have CT scan in January that noted cirrhotic liver.  EGD with no obvious source of bleeding. Abd CTA neg for extravasation -hgb holding ~7.7 to 7.8 Plan Cont protonix and octreotide gtt Intubated for airway protection Large bore CVL, day 2 Possible colonoscopy +/- small bowel enteroscopy Keep 2 units blood ahead  Medication hypotension Plan Keep euvolemic Pressors as needed.  CVL  Mixed picture of metabolic acidosis and respiratory alkalosis  Plan Vol resuscitate  Vent per below  F/u ABG  Aspiration PNA-Noted at OSH, SP stroke Plan Cont aspiration precautions Cont zosyn  ABG post intubation No long-term ventilation (was DNR-->see below)  H/o COPD Cont brovana, pulmicort and duoneb  Hypothyroidism Plan Cont home levothyroxine  History of stroke with paralysis of RUE Plan Swallow eval in future   H/o liver Cirrhosis Transaminits-Patient with AST of 79 at OSH and ALT of 45 here. Repeat AST of 18 and 187. Repeat LFTs today: AST 84, ALT 159, coag studies on 5/30 with PT 15.5, INR 1.2, unlikely that bleeding is related to coagulopathy due to hepatic pathology Plan Can consider repeat CT abd at some point   Best Practice (right click and "Reselect all SmartList Selections" daily)   Diet:  NPO If oral type of diet: NPO Pain/Anxiety/Delirium protocol (if indicated): Yes (RASS goal -1) VAP protocol (if indicated): Yes DVT prophylaxis: Other (comment) SCDs  GI prophylaxis: PPI Glucose control:  SSI No  Central venous access:  Yes, and it is still needed Arterial line:  N/A Foley:  Yes, and it is still needed Restraints ACTION; IS/IS NOT: is not Restraint type: will  evaluate daily Mobility:  bed rest  PT consulted: N/A Studies pending: EGD.  Culture data pending: respiratory culture  Last reviewed culture data:today Antibiotics: vancomycin and Zosyn Antibiotic de-escalation: Yes->stopped vanc Stop date: has will complete 5 total days from start date of 5/28   Daily labs: requested Code Status:  DNR Prognosis: Life-threating Last date of multidisciplinary goals of care discussion [I verified DNR with husband. Wants aggressive medical rx but no prolonged vent. No CPR]  Disposition: DNR    Critical care time: 32 min

## 2020-09-08 ENCOUNTER — Inpatient Hospital Stay (HOSPITAL_COMMUNITY): Payer: Medicare Other

## 2020-09-08 ENCOUNTER — Encounter (HOSPITAL_COMMUNITY)
Admission: AD | Disposition: A | Discharge status: Hospice | Payer: Self-pay | Source: Other Acute Inpatient Hospital | Attending: Pulmonary Disease

## 2020-09-08 ENCOUNTER — Encounter (HOSPITAL_COMMUNITY): Payer: Self-pay | Admitting: Pulmonary Disease

## 2020-09-08 DIAGNOSIS — K5520 Angiodysplasia of colon without hemorrhage: Secondary | ICD-10-CM

## 2020-09-08 DIAGNOSIS — K558 Other vascular disorders of intestine: Secondary | ICD-10-CM

## 2020-09-08 HISTORY — PX: HOT HEMOSTASIS: SHX5433

## 2020-09-08 HISTORY — PX: ENTEROSCOPY: SHX5533

## 2020-09-08 LAB — BASIC METABOLIC PANEL
Anion gap: 9 (ref 5–15)
BUN: 18 mg/dL (ref 8–23)
CO2: 19 mmol/L — ABNORMAL LOW (ref 22–32)
Calcium: 8.1 mg/dL — ABNORMAL LOW (ref 8.9–10.3)
Chloride: 111 mmol/L (ref 98–111)
Creatinine, Ser: 0.63 mg/dL (ref 0.44–1.00)
GFR, Estimated: >60 mL/min (ref >60)
Glucose, Bld: 150 mg/dL — ABNORMAL HIGH (ref 70–99)
Potassium: 3.6 mmol/L (ref 3.5–5.1)
Sodium: 139 mmol/L (ref 135–145)

## 2020-09-08 LAB — CBC
HCT: 24.3 % — ABNORMAL LOW (ref 36.0–46.0)
HCT: 24.3 % — ABNORMAL LOW (ref 36.0–46.0)
HCT: 25.8 % — ABNORMAL LOW (ref 36.0–46.0)
Hemoglobin: 7.8 g/dL — ABNORMAL LOW (ref 12.0–15.0)
Hemoglobin: 7.9 g/dL — ABNORMAL LOW (ref 12.0–15.0)
Hemoglobin: 8.4 g/dL — ABNORMAL LOW (ref 12.0–15.0)
MCH: 29.7 pg (ref 26.0–34.0)
MCH: 29.8 pg (ref 26.0–34.0)
MCH: 29.8 pg (ref 26.0–34.0)
MCHC: 32.1 g/dL (ref 30.0–36.0)
MCHC: 32.5 g/dL (ref 30.0–36.0)
MCHC: 32.6 g/dL (ref 30.0–36.0)
MCV: 92.4 fL (ref 80.0–100.0)
MCV: 91.7 fL (ref 80.0–100.0)
MCV: 91.5 fL (ref 80.0–100.0)
Platelets: 144 10*3/uL — ABNORMAL LOW (ref 150–400)
Platelets: 147 10*3/uL — ABNORMAL LOW (ref 150–400)
Platelets: 149 10*3/uL — ABNORMAL LOW (ref 150–400)
RBC: 2.63 MIL/uL — ABNORMAL LOW (ref 3.87–5.11)
RBC: 2.65 MIL/uL — ABNORMAL LOW (ref 3.87–5.11)
RBC: 2.82 MIL/uL — ABNORMAL LOW (ref 3.87–5.11)
RDW: 16 % — ABNORMAL HIGH (ref 11.5–15.5)
RDW: 15.9 % — ABNORMAL HIGH (ref 11.5–15.5)
RDW: 15.8 % — ABNORMAL HIGH (ref 11.5–15.5)
WBC: 11.1 10*3/uL — ABNORMAL HIGH (ref 4.0–10.5)
WBC: 12 10*3/uL — ABNORMAL HIGH (ref 4.0–10.5)
WBC: 10.1 10*3/uL (ref 4.0–10.5)
nRBC: 0 % (ref 0.0–0.2)
nRBC: 0 % (ref 0.0–0.2)
nRBC: 0 % (ref 0.0–0.2)

## 2020-09-08 LAB — GLUCOSE, CAPILLARY
Glucose-Capillary: 137 mg/dL — ABNORMAL HIGH (ref 70–99)
Glucose-Capillary: 142 mg/dL — ABNORMAL HIGH (ref 70–99)
Glucose-Capillary: 110 mg/dL — ABNORMAL HIGH (ref 70–99)
Glucose-Capillary: 111 mg/dL — ABNORMAL HIGH (ref 70–99)
Glucose-Capillary: 115 mg/dL — ABNORMAL HIGH (ref 70–99)
Glucose-Capillary: 96 mg/dL (ref 70–99)

## 2020-09-08 SURGERY — ENTEROSCOPY
Anesthesia: Moderate Sedation

## 2020-09-08 MED ORDER — FENTANYL CITRATE (PF) 100 MCG/2ML IJ SOLN
INTRAMUSCULAR | Status: AC
  Filled 2020-09-08: qty 4

## 2020-09-08 MED ORDER — MIDAZOLAM HCL (PF) 5 MG/ML IJ SOLN
INTRAMUSCULAR | Status: AC
  Filled 2020-09-08: qty 2

## 2020-09-08 MED ORDER — VITAL HIGH PROTEIN PO LIQD
1000.0000 mL | ORAL | Status: DC
  Administered 2020-09-08: 1000 mL

## 2020-09-08 NOTE — Progress Notes (Signed)
NAME:  Tammy Cortez, MRN:  [id number], DOB:  1945/04/24, LOS: 2 ADMISSION DATE:  09/06/2020, CONSULTATION DATE:  09/05/20 REFERRING MD:  NA, CHIEF COMPLAINT:  GI bleed  History of Present Illness:   This is a 75 year old female with history of esophageal stenosis requiring dilation with Dr. Lyndel Safe, HTN, acquired hypothyroidism, COPD, cirrhosis of the liver, HTN,and breast cancer(with right paratracheal lymph node recently seen in 2022 that was pet avid but has not been biopsied as far as we can tell.) who presents from [facility]. She was initally admitted after a COPD exacerbation for presumed aspiration pneumonia and encephailits secondary to this. She was altered presumed to be from aspiration pna. She had a stroke 8 weeks prior to this with right sided deficits thereafter. She was becoming increasingly more septic requiring fluid bolus's and escalation of abx to vancomycin and Zosyn. Accompanying the drop in blood pressures patient had a drop in hgb from 8.6 to 5.0. Also noted to have dark tarry stools.  Shortly after these lab abnormalities were appreciated patient had a large volume hematemesis. Per transferring physicioan patietn has no history of GI bleed in past. She is not a blood thinner regularly. Her INR was 1.0. Given that there is not a GI service at [facility] it was decided to transfer patient to our ICU for for further evaluation of her GI bleed.   Had head CT that showed no acute intracranial pathology on 09/05/20  CT abdomen January 04/30/20 noting liver with cirrhotic morphology.    Pertinent  Medical History  HTN Hypothyroidism Breast cancer with hypermetabolic lymph node that has nbto biopsied yet esophageal stenosis requiring dilation in the past COPD  Significant Hospital Events: Including procedures, antibiotic start and stop dates in addition to other pertinent events   . Patietn admitted to the ICU on 09/04/20 On vanc and zosyn from OSH for aspiration PNA. vanc  and zosyn started  . 5/30 to ICU at [facility]. Octreotide gtt started. Intubated. CVL placed all for EGD. Vanc stopped . S/p EGD with AVM discovered  Interim History / Subjective:  Intubated, on ventilator Sedated Still requiring pressors She is arousable  Objective   Blood pressure 138/70, pulse 67, temperature 97.6 F (36.4 C), temperature source Oral, resp. rate 14, height 5\' 2"  (1.575 m), weight 73.1 kg, SpO2 100 %. CVP:  [4 mmHg-69 mmHg] 10 mmHg  Vent Mode: PRVC FiO2 (%):  [30 %-40 %] 30 % Set Rate:  [20 bmp] 20 bmp Vt Set:  [400 mL] 400 mL PEEP:  [5 cmH20] 5 cmH20 Plateau Pressure:  [14 cmH20-17 cmH20] 14 cmH20   Intake/Output Summary (Last 24 hours) at 09/08/2020 1348 Last data filed at 09/08/2020 1200 Gross per 24 hour  Intake 5523.97 ml  Output 2175 ml  Net 3348.97 ml   Filed Weights   09/06/20 0200 09/07/20 0410 09/08/20 0500  Weight: 69.9 kg 72.3 kg 73.1 kg    Examination:  Chronically ill-appearing HENT: Moist oral mucosa, anicteric Pulmonary: Rhonchi appreciated Cardiac: S1-S2 appreciated Abdomen: Bowel sounds appreciated GU clear yellow output Neuro: Sedated  Labs/imaging that I havepersonally reviewed  (right click and "Reselect all SmartList Selections" daily)    Resolved Hospital Problem list   NA  Assessment & Plan:  This is a 75 yo female with history as noted above who presents with GI bleed.  Acute GI bleed -AVM on endoscopy treated with APC and banding -Still concern for ongoing bleeding, scheduled for enteroscopy today -Continue Protonix and octreotide -Appreciate GI  follow-up  Hypotension is stable on pressors  Metabolic acidosis -Multifactorial  Aspiration pneumonia -On Zosyn  Acute hypoxemic respiratory failure Chronic obstructive pulmonary disease -Continue bronchodilators including Brovana, Pulmicort, DuoNeb -Continue full ventilator support  Hypothyroidism -On Synthroid  History of CVA -Residual right upper extremity  weakness  History liver cirrhosis -Monitor and trend LFTs   Best Practice (right click and "Reselect all SmartList Selections" daily)   Diet:  NPO-decision regarding feeding post evaluation for GI bleed If oral type of diet: NPO Pain/Anxiety/Delirium protocol (if indicated): Yes (RASS goal -1) VAP protocol (if indicated): Yes DVT prophylaxis: Other (comment) SCDs  GI prophylaxis: PPI Glucose control:  SSI No  Central venous access:  Yes, and it is still needed Arterial line:  N/A Foley:  Yes, and it is still needed Restraints ACTION; IS/IS NOT: is not Restraint type: will evaluate daily Mobility:  bed rest  PT consulted: N/A Studies pending: Enteroscopy and possible colonoscopy Culture data pending: respiratory culture  Last reviewed culture data:today Antibiotics: Zosyn Antibiotic de-escalation: Continue Zosyn at present Stop date: has will complete 5 total days from start date of 5/28   Daily labs: requested Code Status:  DNR Prognosis: Life-threating Last date of multidisciplinary goals of care discussion [I verified DNR with husband. Wants aggressive medical rx but no prolonged vent. No CPR]  Disposition: DNR  The patient is critically ill with multiple organ systems failure and requires high complexity decision making for assessment and support, frequent evaluation and titration of therapies, application of advanced monitoring technologies and extensive interpretation of multiple databases. Critical Care Time devoted to patient care services described in this note independent of APP/resident time (if applicable)  is 35 minutes.   Sherrilyn Rist MD [facility] Personal pager: [phone] If unanswered, please page CCM On-call: [phone]

## 2020-09-08 NOTE — Progress Notes (Signed)
NAME:  Tammy Cortez, MRN:  [id number], DOB:  [birth year], LOS: 2 ADMISSION DATE:  09/06/2020, CONSULTATION DATE:  09/05/20 REFERRING MD:  NA, CHIEF COMPLAINT:  GI bleed  History of Present Illness:   This is a 75 year old female with history of esophageal stenosis requiring dilation with Dr. Lyndel Safe, HTN, acquired hypothyroidism, COPD, cirrhosis of the liver, HTN,and breast cancer(with right paratracheal lymph node recently seen in 2022 that was pet avid but has not been biopsied as far as we can tell.) who presents from [facility]. She was initally admitted after a COPD exacerbation for presumed aspiration pneumonia and encephailits secondary to this. She was altered presumed to be from aspiration pna. She had a stroke 8 weeks prior to this with right sided deficits thereafter. She was becoming increasingly more septic requiring fluid bolus's and escalation of abx to vancomycin and Zosyn. Accompanying the drop in blood pressures patient had a drop in hgb from 8.6 to 5.0. Also noted to have dark tarry stools.  Shortly after these lab abnormalities were appreciated patient had a large volume hematemesis. Per transferring physicioan patient has no history of GI bleed in past. She is not a blood thinner regularly. Her INR was 1.0. Given that there is not a GI service at [facility] it was decided to transfer patient to our ICU for for further evaluation of her GI bleed.    -Had head CT that showed no acute intracranial pathology on 09/05/20.  -CT abdomen January 04/30/20 noting liver with cirrhotic morphology.  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     Pertinent  Medical History  HTN Hypothyroidism Breast cancer with hypermetabolic lymph node that has nbto biopsied yet esophageal stenosis requiring dilation x2 in the  past COPD Stroke with RUE paralysis (recent)  Significant Hospital Events: Including procedures, antibiotic start and stop dates in addition to other pertinent events   . Patient admitted to the ICU on 09/04/20 On vanc and zosyn from OSH for aspiration PNA. Vanc and zosyn started  . 5/30 to ICU at [facility]. Octreotide gtt started. Intubated. CVL placed all for EGD. Vanc stopped  Interim History / Subjective:  Patient continues to be mildly agitated this morning.  Afebrile overnight, norepi at 4 mcg, pressor requirement continues to lessen. She is still passing copious melena.  Has been on 30% FiO2 and PEEP of 5 overnight.  Plan for colonoscopy +/- small bowel enteroscopy today around 2 pm.   Objective   Blood pressure (!) 117/59, pulse (!) 50, temperature 98.4 F (36.9 C), temperature source Axillary, resp. rate 20, height 5\' 2"  (1.575 m), weight 73.1 kg, SpO2 100 %. CVP:  [5 mmHg-69 mmHg] 8 mmHg  Vent Mode: PRVC FiO2 (%):  [30 %-40 %] 30 % Set Rate:  [20 bmp] 20 bmp Vt Set:  [400 mL] 400 mL PEEP:  [5 cmH20] 5 cmH20 Plateau Pressure:  [15 cmH20-17 cmH20] 17 cmH20   Intake/Output Summary (Last 24 hours) at 09/08/2020 0738 Last data filed at 09/08/2020 0656 Gross per 24 hour  Intake 6000.31 ml  Output 2325 ml  Net 3675.31 ml   Filed Weights   09/06/20 0200 09/07/20 0410 09/08/20 0500  Weight: 69.9 kg 72.3 kg 73.1 kg    Physical Examination: General: this is a chronically ill appearing 75 year old female HENT: sclera are pale. MM are pale Pulm:  CTAB, normal WOB  CV: 2/6 systolic HM, RRR Ext: warm dependent edema scattered areas of ecchymosis  Abd: soft, ND, coffee gd appearing stool in rectal bag GU cl yellow Neuro: still with light sedation, disoriented, unable to follow commands, arouses to verbal and tactile stimuli   Labs/imaging that I havepersonally reviewed  (right click and "Reselect all SmartList Selections" daily)  BMP, CBC    Resolved Hospital Problem list    NA  Assessment & Plan:  This is a 75 yo female with history as noted above who presents with GI bleed.    Acute Upper GI bleed.  No stigmatta of liver failure to suspect varices.  However patient did have CT scan in January that noted cirrhotic liver.  EGD with no obvious source of bleeding. Abd CTA neg for extravasation -hgb holding ~7.7 to 7.8 Plan Cont protonix and octreotide gtt Intubated for airway protection Large bore CVL, day 3 Possible colonoscopy +/- small bowel enteroscopy today Keep 2 units blood ahead  Medication hypotension Plan Keep euvolemic Pressors as needed.  CVL  Mixed picture of metabolic acidosis and respiratory alkalosis  Plan Vol resuscitate  Vent per below  F/u ABG  Aspiration PNA-Noted at OSH, SP stroke Plan Cont aspiration precautions Cont zosyn, day 4/5  ABG post intubation No long-term ventilation (was DNR-->see below)  H/o COPD Cont brovana, pulmicort and duoneb  Hypothyroidism Plan Cont home levothyroxine  History of stroke with paralysis of RUE Plan Swallow eval in future   H/o liver Cirrhosis Transaminits-Patient with AST of 79 at OSH and ALT of 45 here. Repeat AST of 18 and 187. Repeat LFTs today: AST 84, ALT 159, coag studies on 5/30 with PT 15.5, INR 1.2, unlikely that bleeding is related to coagulopathy due to hepatic pathology Plan Can consider repeat CT abd at some point   Best Practice (right click and "Reselect all SmartList Selections" daily)   Diet:  NPO If oral type of diet: NPO Pain/Anxiety/Delirium protocol (if indicated): Yes (RASS goal -1) VAP protocol (if indicated): Yes DVT prophylaxis: Other (comment) SCDs  GI prophylaxis: PPI Glucose control:  SSI No  Central venous access:  Yes, and it is still needed Arterial line:  N/A Foley:  Yes, and it is still needed Restraints ACTION; IS/IS NOT: is not Restraint type: will evaluate daily Mobility:  bed rest  PT consulted: N/A Studies pending: EGD.   Culture data pending: respiratory culture  Last reviewed culture data:today Antibiotics: vancomycin and Zosyn Antibiotic de-escalation: Yes->stopped vanc Stop date: has will complete 5 total days from start date of 5/28   Daily labs: requested Code Status:  DNR Prognosis: Life-threating Last date of multidisciplinary goals of care discussion [I verified DNR with husband. Wants aggressive medical rx but no prolonged vent. No CPR]  Disposition: DNR    Critical care time: 32 min

## 2020-09-08 NOTE — Progress Notes (Signed)
Initial Nutrition Assessment  DOCUMENTATION CODES:   Not applicable  INTERVENTION:   Initiate tube feeding via NG tube: Vital High Protein at 20 ml/h (480 ml per day)  Provides 480 kcal, 42 gm protein, 401 ml free water daily.  When clinical status allows, recommend increase Vital High Protein to goal rate of 35 ml/h with Prosource TF 45 ml TID to provide 960 kcal (1514 kcal total with propofol), 107 gm protein, 702 ml free water daily.  NUTRITION DIAGNOSIS:   Inadequate oral intake related to inability to eat as evidenced by NPO status.  GOAL:   Patient will meet greater than or equal to 90% of their needs  MONITOR:   Vent status,TF tolerance,Labs  REASON FOR ASSESSMENT:   Ventilator,Consult Enteral/tube feeding initiation and management  ASSESSMENT:   75 yo female admitted with GI bleed. Transferred from [facility] where she was admitted with COPD exacerbation. PMH includes esophageal stenosis requiring dilation, HTN, hypothyroidism, COPD, liver cirrhosis, breast cancer, recent stroke.   5/30 Intubated; S/P upper endoscopy  Found to have bleeding gastric AVM s/p clipping, grade 1 mid-esophageal varices, non-bleeding duodenal bulb AVM treated with APC.  Discussed patient in ICU rounds and with RN today. S/P small bowel enteroscopy today.  Received MD Consult for TF initiation and management. RN placed NG tube today and TF has been started: Vital High protein at 20 ml/h.  Patient is currently intubated on ventilator support MV: 8.7 L/min Temp (24hrs), Avg:97.8 F (36.6 C), Min:97.6 F (36.4 C), Max:98.4 F (36.9 C)  Propofol: 21 ml/hr providing 554 kcal from lipid.  Labs reviewed.  CBG: [phone]  Medications reviewed and include novolog, protonix, octreotide, propofol, levophed.  Usual weights reviewed. Patient has lost 13% of her usual weight from 04/20/20 to 09/06/20. 13% weight loss within 6 months is severe.  NUTRITION - FOCUSED PHYSICAL  EXAM:  unable to complete  Diet Order:   Diet Order            Diet NPO time specified  Diet effective now                 EDUCATION NEEDS:   Not appropriate for education at this time  Skin:  Skin Assessment: Reviewed RN Assessment (MASD to buttocks)  Last BM:  6/1 type 7  Height:   Ht Readings from Last 1 Encounters:  09/06/20 5\' 2"  (1.575 m)    Weight:   Wt Readings from Last 1 Encounters:  09/08/20 73.1 kg   Admission weight 69.9 kg 5/30  Ideal Body Weight:  50 kg  BMI:  Body mass index is 29.48 kg/m.  Estimated Nutritional Needs:   Kcal:  1350  Protein:  100-115 gm  Fluid:  1.5-1.7 L    Lucas Mallow, RD, LDN, CNSC Please refer to Amion for contact information.

## 2020-09-08 NOTE — Op Note (Signed)
[facility] Patient Name: Tammy Cortez Procedure Date : 09/08/2020 MRN: [id number] Attending MD: Docia Chuck. Henrene Pastor , MD Date of Birth: January 02, 1946 CSN: [id number] Age: 75 Admit Type: Inpatient Procedure:                Small bowel enteroscopy with control of bleeding                            (APC) Indications:              Melena. Upper endoscopy 2 days ago revealed                            bleeding AVM and nonbleeding AVM both treated                            endoscopically. Patient has since had dark stools                            with gut lavage and slow drift in hemoglobin. Now                            for relook endoscopy/enteroscopy Providers:                Docia Chuck. Henrene Pastor, MD, Dulcy Fanny, Elspeth Cho, Technician Referring MD:             [facility] hospitalist Medicines:                Monitored Anesthesia Care. Patient intubated in ICU Complications:            No immediate complications. Estimated Blood Loss:     Estimated blood loss: none. Procedure:                Pre-Anesthesia Assessment:                           - Prior to the procedure, a History and Physical                            was performed, and patient medications and                            allergies were reviewed. The patient's tolerance of                            previous anesthesia was also reviewed. The risks                            and benefits of the procedure and the sedation                            options and risks were discussed with the patient.  All questions were answered, and informed consent                            was obtained. Prior Anticoagulants: The patient has                            taken no previous anticoagulant or antiplatelet                            agents. ASA Grade Assessment: IV - A patient with                            severe systemic disease that is a constant threat                             to life. After reviewing the risks and benefits                            with the patient's husband, the patient was deemed                            in satisfactory condition to undergo the procedure.                           After obtaining informed consent from the patient's                            husband, the standard upper endoscope was passed                            under direct vision. After completing that                            examination, the standard upper endoscope was                            exchanged for the pediatric colonoscope. The                            patient was intubated. Throughout the procedure,                            the patient's blood pressure, pulse, and oxygen                            saturations were monitored continuously. The                            PCF-H190DL ([id number]) Olympus pediatric colonoscope                            was introduced through the mouth and advanced to  the proximal jejunum. The GIF-H190 ([id number])                            Olympus gastroscope was introduced through the and                            advanced to the. The small bowel enteroscopy was                            accomplished without difficulty. The patient                            tolerated the procedure well. Scope In: Scope Out: Findings:      The esophagus revealed a large caliber distal esophageal ringlike       stricture, but was otherwise normal.      The stomach revealed the recently placed Endo Clip along the lesser       curvature. No other significant abnormalities. No active bleeding.      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 There were at least a dozen lesions noted. 4 5 were large. None       were bleeding. All were treated with APC.. Coagulation for bleeding        prevention using argon plasma was successful.      A few angioectasias with no bleeding were found in the most proximal       jejunum. These were also treated with APC. Evaluation of the proximal       jejunum beyond, to the length of the pediatric colonoscope, did not       demonstrate other lesions or active bleeding Impression:               1. 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                           2. Incidental esophageal stricture                           3. Previously placed Endo Clip in secure position                           4. Dark effluent in rectal bag today was dark bile,                            not blood. Recommendation:           1. Observe clinical status for evidence of revealing                           2.  Supportive care at this point with transfusions                            as needed                           3. No plans for colonoscopy.                           4. Stop octreotide                           5. Stop bowel prep                           All of the above were discussed with the patient's                            husband Marcello Moores. [phone]                           . Procedure Code(s):        --- Professional ---                           [id number], Small intestinal endoscopy, enteroscopy                            beyond second portion of duodenum, not including                            ileum; with control of bleeding (eg, injection,                            bipolar cautery, unipolar cautery, laser, heater                            probe, stapler, plasma coagulator) Diagnosis Code(s):        --- Professional ---                           K22.2, Esophageal obstruction                           K31.819, Angiodysplasia of stomach and duodenum                            without bleeding                            K55.20, Angiodysplasia of colon without hemorrhage                           K92.1, Melena (includes Hematochezia) CPT copyright 2019 American [employer] Association. All rights reserved. The codes documented in this report are preliminary and upon coder review may  be revised to meet current compliance requirements. Docia Chuck. Henrene Pastor, MD 09/08/2020 5:21:45 PM This report has been signed  electronically. Number of Addenda: 0

## 2020-09-08 NOTE — Progress Notes (Addendum)
Progress Note  Chief Complaint:    GI bleed     ASSESSMENT / PLAN:    # 75 yo female COPD, recent CVA, breast cancer(with right paratracheal lymph node recentl, y seen in 2022and cirrhosis admitted with hemorrhagic shock. EGD this admission with oozing gastrc AVM s/p APC and clip placement. Grade I esophageal varices without active bleeding or stigmata .  Small nonbleeding duodenal bulb AVM treated with APC. Amount of blood loss felt to be out of proportion to EGD findings.  CTA following EGD was negative.  --Received additional bowel prep yesterday / early this am.  Hgb down overnight from 8.6 to 7.8 but output is thinner and lighter in color compared to yesterday so I think the bleeding is at least slowing down.   --Will proceed with a small bowel enteroscopy at bedside this am. I don't think effluent is clear enough for colonoscopy   --Hgb down from 8.6 to 7.8 overnight.  --Still on Octreotide at this point ( ? More distal small bowel varix)  --SBP 120's on Levophed.   ADDENDUM: I spoke to husband Marcello Moores on phone. The risks and benefits of EGD with possible biopsies was discussed with the husband and he gives [place] consent to proceed.     # Elevated liver enzymes, improving.  # Aspiration PNA noted at outside hospital --On zosyn  # DNR    OBJECTIVE:    Scheduled inpatient medications:  . sodium chloride   Intravenous Once  . arformoterol  15 mcg Nebulization BID  . budesonide (PULMICORT) nebulizer solution  0.25 mg Nebulization BID  . chlorhexidine gluconate (MEDLINE KIT)  15 mL Mouth Rinse BID  . Chlorhexidine Gluconate Cloth  6 each Topical Q0600  . docusate  100 mg Per Tube BID  . insulin aspart  0-15 Units Subcutaneous Q4H  . insulin aspart  0-5 Units Subcutaneous QHS  . ipratropium-albuterol  3 mL Nebulization Q6H  . levothyroxine  50 mcg Per Tube Q0600  . mouth rinse  15 mL Mouth Rinse 10 times per day  . octreotide  50 mcg Intravenous Once  . [START ON  09/09/2020] pantoprazole  40 mg Intravenous Q12H  . sodium chloride flush  10-40 mL Intracatheter Q12H   Continuous inpatient infusions:  . sodium chloride Stopped (09/06/20 1928)  . fentaNYL infusion INTRAVENOUS 50 mcg/hr (09/08/20 0800)  . norepinephrine (LEVOPHED) Adult infusion 4 mcg/min (09/08/20 0800)  . octreotide  (SANDOSTATIN)    IV infusion 50 mcg/hr (09/08/20 0800)  . pantoprozole (PROTONIX) infusion 8 mg/hr (09/08/20 0800)  . piperacillin-tazobactam (ZOSYN)  IV Stopped (09/08/20 0729)  . propofol (DIPRIVAN) infusion 20 mcg/kg/min (09/08/20 0800)   PRN inpatient medications: fentaNYL, fentaNYL (SUBLIMAZE) injection, fentaNYL (SUBLIMAZE) injection, sodium chloride flush  Vital signs in last 24 hours: Temp:  [97.7 F (36.5 C)-98.5 F (36.9 C)] 98.4 F (36.9 C) (06/01 0716) Pulse Rate:  [49-73] 69 (06/01 0815) Resp:  [0-27] 20 (06/01 0815) BP: (90-134)/(48-81) 120/66 (06/01 0815) SpO2:  [100 %] 100 % (06/01 0815) FiO2 (%):  [30 %-40 %] 30 % (06/01 0800) Weight:  [73.1 kg] 73.1 kg (06/01 0500) Last BM Date: 09/08/20  Intake/Output Summary (Last 24 hours) at 09/08/2020 0852 Last data filed at 09/08/2020 0800 Gross per 24 hour  Intake 5941.78 ml  Output 2325 ml  Net 3616.78 ml     Physical Exam:  . General: Alert, sitting up and moving about in bed looking around. In NAD . Heart:  Regular rate and rhythm.  Marland Kitchen  Pulmonary: Intubated on ventilator. No wheezing heard.  . Abdomen: Soft, nondistended, nontender. Normal bowel sounds.  . Psych: doesn't follow commands   Filed Weights   09/06/20 0200 09/07/20 0410 09/08/20 0500  Weight: 69.9 kg 72.3 kg 73.1 kg    Intake/Output from previous day: 05/31 0701 - 06/01 0700 In: 6000.3 [I.V.:2261; NG/GT:3430; IV Piggyback:309.3] Out: 2325 [Urine:725; Stool:1600] Intake/Output this shift: Total I/O In: 81 [I.V.:74.4; IV Piggyback:6.6] Out: -     Lab Results: Recent Labs    09/07/20 1620 09/07/20 2129 09/08/20 0500  WBC  15.0* 14.4* 11.1*  HGB 8.4* 8.6* 7.8*  HCT 25.3* 26.1* 24.3*  PLT 147* 163 144*   BMET Recent Labs    09/06/20 0417 09/06/20 0554 09/06/20 0951 09/07/20 0345 09/08/20 0500  NA 140   < > 142 142 139  K 3.4*   < > 3.8 3.5 3.6  CL 116*  --   --  118* 111  CO2 17*  --   --  16* 19*  GLUCOSE 126*  --   --  205* 150*  BUN 51*  --   --  32* 18  CREATININE 0.77  --   --  0.75 0.63  CALCIUM 8.5*  --   --  8.0* 8.1*   < > = values in this interval not displayed.   LFT Recent Labs    09/07/20 0345  PROT 4.3*  ALBUMIN 2.1*  AST 84*  ALT 159*  ALKPHOS 49  BILITOT 0.9   PT/INR Recent Labs    09/06/20 0417  LABPROT 15.5*  INR 1.2   Hepatitis Panel No results for input(s): HEPBSAG, HCVAB, HEPAIGM, HEPBIGM in the last 72 hours.  DG Abd 1 View  Result Date: 09/06/2020 CLINICAL DATA:  75 year female status post enteric tube placement. EXAM: ABDOMEN - 1 VIEW COMPARISON:  CT abdomen pelvis dated 09/06/2020. FINDINGS: Enteric tube makes a loop in the left upper abdomen likely in the body of the stomach. The tip of the tube is likely in the distal body of the stomach. No bowel dilatation. A central venous line is partially visualized with tip close to the cavoatrial junction. The tip of the endotracheal tube is approximately 11 mm above the carina. Recommend retraction by 3 cm for optimal positioning. Small left pleural effusion and left lung base atelectasis. IMPRESSION: 1. Enteric tube with tip likely in the distal body of the stomach. 2. Endotracheal tube with tip 11 mm above the carina. Recommend retraction by 3 cm for optimal positioning. Electronically Signed   By: Anner Crete M.D.   On: 09/06/2020 15:14   CT Angio Abd/Pel w/ and/or w/o  Result Date: 09/06/2020 CLINICAL DATA:  Melena and anemia.  Concern for GI bleeding. EXAM: CTA ABDOMEN AND PELVIS WITHOUT AND WITH CONTRAST TECHNIQUE: Multidetector CT imaging of the abdomen and pelvis was performed using the standard  protocol during bolus administration of intravenous contrast. Multiplanar reconstructed images and MIPs were obtained and reviewed to evaluate the vascular anatomy. CONTRAST:  44m OMNIPAQUE IOHEXOL 350 MG/ML SOLN COMPARISON:  CT the chest, abdomen pelvis-05/11/2020;; 03/24/2015; abdominal MRI-09/28/2018. FINDINGS: VASCULAR Aorta: There is a moderate amount of calcified atherosclerotic plaque within a normal caliber abdominal aorta, not resulting in a hemodynamically significant stenosis. No evidence of abdominal aortic dissection or periaortic stranding. Celiac: There is a minimal amount of eccentric calcified atherosclerotic plaque involving the origin the celiac artery, not resulting in a hemodynamically significant stenosis. Conventional branching pattern. 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 SMA: There is a moderate amount of eccentric mixed calcified and noncalcified atherosclerotic plaque involving the origin and proximal aspect of the SMA which approaches 50% luminal narrowing (representative sagittal image 97, series 9). Conventional branching pattern. The distal tributaries the SMA appear widely patent without discrete internal filling defect to suggest distal embolism. Renals: Solitary bilaterally; there is a minimal amount of eccentric calcified atherosclerotic plaque involving the origin of the bilateral renal arteries, not resulting in a hemodynamically significant stenosis. No vessel irregularity to suggest FMD. IMA: Diseased at its origin though remains patent. Inflow: There is a moderate amount of eccentric prominently calcified atherosclerotic plaque involving the bilateral common and external iliac arteries, not definitely resulting in a hemodynamically significant stenosis. The  bilateral internal iliac arteries are diseased though patent and of normal caliber. Proximal Outflow: There is a moderate amount of eccentric calcified atherosclerotic plaque involving the distal aspect of the right common femoral artery which approaches 50% luminal narrowing (image 131, series 6). There is a minimal amount of eccentric predominantly calcified atherosclerotic plaque involving the left common femoral artery, not resulting in a hemodynamically significant stenosis. The bilateral deep and superficial femoral arteries are of normal caliber and widely patent throughout their imaged courses. Veins: The IVC and pelvic venous systems appear widely patent. Review of the MIP images confirms the above findings. _________________________________________________________ _________________________________________________________ NON-VASCULAR Lower chest: Limited visualization of the lower thorax demonstrates trace bilateral pleural effusions with associated subpleural consolidative opacities, left greater than right. Coronary artery calcifications. Calcifications within the mitral valve annulus. There is persistent deviation of the heart into the left hemithorax, similar to the 05/11/2020 examination. No pericardial effusion. Hepatobiliary: Mild nodularity hepatic contour. There is a punctate (approximately 0.7 cm) hypoattenuating lesion within the medial segment of the left lobe of the liver previously characterized as a hepatic cyst on 09/28/2018. No discrete worrisome hepatic lesions. There is a punctate stone lying dependently within an otherwise normal-appearing gallbladder. No gallbladder wall thickening or pericholecystic stranding. No ascites. Pancreas: Normal appearance of the pancreas. Spleen: Normal appearance of the spleen. Adrenals/Urinary Tract: There is symmetric enhancement of the bilateral kidneys. Note is made of left-sided parapelvic cysts. No discrete right-sided renal lesions. No urine  obstruction or perinephric stranding. Normal appearance of the bilateral adrenal glands. The urinary bladder is decompressed with a Foley catheter. Stomach/Bowel: There are no discrete areas of intraluminal contrast extravasation to suggest the etiology of reported history of GI bleeding. Two radiopaque pill fragments are seen within the gastric fundus. There is apparent mild-to-moderate circumferential colonic wall thickening without evidence of enteric obstruction. Rather extensive diverticulosis primarily involving the sigmoid colon. Moderate stool burden, most conspicuous within the rectum. Surgical clips about the gastroesophageal junction with persistent moderate-sized hiatal hernia. No pneumoperitoneum, pneumatosis or portal venous gas. Lymphatic: No bulky retroperitoneal, mesenteric, pelvic or inguinal lymph adenopathy. Reproductive: Normal appearance of the pelvic organs. No free fluid in the pelvic cul-de-sac. Other: Diffuse body wall anasarca. Musculoskeletal: No acute or aggressive osseous abnormalities. Post L4-S1 paraspinal fusion and intervertebral disc space replacement. Unchanged malpositioning of the right L4 pedicular screw extending to involve the right lateral aspect of the L3-L4 articulation, similar to remote abdominal CT performed 09/26/2019. redemonstrated moderate severe DDD within the remainder of the lumbar spine, worse at L3-L4 with disc space height loss, endplate irregularity and  sclerosis. Stigmata of dish within the lower thoracic spine. Degenerative change the pubic symphysis. Mild degenerative change the bilateral hips with joint space loss, subchondral sclerosis and osteophytosis. IMPRESSION: VASCULAR 1. No discrete areas of enteric intraluminal contrast extravasation to suggest etiology of reported history of lower GI bleeding. 2. Moderate amount of atherosclerotic plaque within normal caliber abdominal aorta. Aortic Atherosclerosis (ICD10-I70.0). 3. Suspected hemodynamically  significant narrowing involving the origin and proximal aspect of the SMA. The celiac and IMA both remain patent. 4. Suspected hemodynamically significant narrowing involving the distal aspect of right common femoral artery. Correlation for symptoms of right lower extremity PAD is advised. NON-VASCULAR 1. Apparent diffuse colonic wall thickening without evidence of enteric obstruction, nonspecific though suggestive of an enteritis, typically of infectious or inflammatory etiology. 2. Rather extensive colonic diverticulosis, most conspicuously involving the sigmoid colon, without evidence of superimposed acute diverticulitis. 3. Moderate colonic stool burden, particularly within the rectal vault. 4. Stigmata of cirrhosis.  Correlation with LFTs is advised. 5. Cholelithiasis without evidence of cholecystitis. 6. Surgical clips about the GE junction with persistent moderate-sized hiatal hernia. 7. Trace bilateral effusions with associated bibasilar opacities, atelectasis versus infiltrate. Electronically Signed   By: Sandi Mariscal M.D.   On: 09/06/2020 11:31    Active Problems:   GI bleed     LOS: 2 days   Tye Savoy ,NP 09/08/2020, 8:52 AM  GI ATTENDING  Interval history data reviewed.  Patient seen and examined.  Agree with interval progress note as outlined above.  May possibly be having mild bleeding, likely upper.  Agree with plans for EGD/enteroscopy to evaluate previously treated lesions and rule out other lesions.  Further recommendations to be determined after that examination.  This will be performed today in the intensive care unit at the bedside.  Patient is high risk.  Procedure consent was obtained by the GI nurse practitioner with the patient's husband.The nature of the procedure, as well as the risks, benefits, and alternatives were carefully and thoroughly reviewed with the patient. Ample time for discussion and questions allowed. The patient understood, was satisfied, and agreed to  proceed.  Docia Chuck. Geri Seminole., M.D. [facility] Division of Gastroenterology

## 2020-09-09 ENCOUNTER — Encounter (HOSPITAL_COMMUNITY): Payer: Self-pay | Admitting: Internal Medicine

## 2020-09-09 DIAGNOSIS — K5520 Angiodysplasia of colon without hemorrhage: Secondary | ICD-10-CM | POA: Diagnosis not present

## 2020-09-09 DIAGNOSIS — K558 Other vascular disorders of intestine: Secondary | ICD-10-CM | POA: Diagnosis not present

## 2020-09-09 LAB — CBC
HCT: 22.9 % — ABNORMAL LOW (ref 36.0–46.0)
HCT: 22.6 % — ABNORMAL LOW (ref 36.0–46.0)
HCT: 28.4 % — ABNORMAL LOW (ref 36.0–46.0)
Hemoglobin: 7.6 g/dL — ABNORMAL LOW (ref 12.0–15.0)
Hemoglobin: 7.3 g/dL — ABNORMAL LOW (ref 12.0–15.0)
Hemoglobin: 9.4 g/dL — ABNORMAL LOW (ref 12.0–15.0)
MCH: 30.4 pg (ref 26.0–34.0)
MCH: 29.8 pg (ref 26.0–34.0)
MCH: 29.8 pg (ref 26.0–34.0)
MCHC: 33.2 g/dL (ref 30.0–36.0)
MCHC: 32.3 g/dL (ref 30.0–36.0)
MCHC: 33.1 g/dL (ref 30.0–36.0)
MCV: 91.6 fL (ref 80.0–100.0)
MCV: 92.2 fL (ref 80.0–100.0)
MCV: 90.2 fL (ref 80.0–100.0)
Platelets: 138 10*3/uL — ABNORMAL LOW (ref 150–400)
Platelets: 141 10*3/uL — ABNORMAL LOW (ref 150–400)
Platelets: 159 10*3/uL (ref 150–400)
RBC: 2.5 MIL/uL — ABNORMAL LOW (ref 3.87–5.11)
RBC: 2.45 MIL/uL — ABNORMAL LOW (ref 3.87–5.11)
RBC: 3.15 MIL/uL — ABNORMAL LOW (ref 3.87–5.11)
RDW: 15.8 % — ABNORMAL HIGH (ref 11.5–15.5)
RDW: 15.9 % — ABNORMAL HIGH (ref 11.5–15.5)
RDW: 15.8 % — ABNORMAL HIGH (ref 11.5–15.5)
WBC: 9.1 10*3/uL (ref 4.0–10.5)
WBC: 9.1 10*3/uL (ref 4.0–10.5)
WBC: 11.7 10*3/uL — ABNORMAL HIGH (ref 4.0–10.5)
nRBC: 0 % (ref 0.0–0.2)
nRBC: 0.2 % (ref 0.0–0.2)
nRBC: 0 % (ref 0.0–0.2)

## 2020-09-09 LAB — BASIC METABOLIC PANEL
Anion gap: 8 (ref 5–15)
BUN: 12 mg/dL (ref 8–23)
CO2: 20 mmol/L — ABNORMAL LOW (ref 22–32)
Calcium: 8.1 mg/dL — ABNORMAL LOW (ref 8.9–10.3)
Chloride: 111 mmol/L (ref 98–111)
Creatinine, Ser: 0.56 mg/dL (ref 0.44–1.00)
GFR, Estimated: >60 mL/min (ref >60)
Glucose, Bld: 111 mg/dL — ABNORMAL HIGH (ref 70–99)
Potassium: 3.3 mmol/L — ABNORMAL LOW (ref 3.5–5.1)
Sodium: 139 mmol/L (ref 135–145)

## 2020-09-09 LAB — MAGNESIUM: Magnesium: 1.8 mg/dL (ref 1.7–2.4)

## 2020-09-09 LAB — GLUCOSE, CAPILLARY
Glucose-Capillary: 114 mg/dL — ABNORMAL HIGH (ref 70–99)
Glucose-Capillary: 119 mg/dL — ABNORMAL HIGH (ref 70–99)
Glucose-Capillary: 119 mg/dL — ABNORMAL HIGH (ref 70–99)
Glucose-Capillary: 98 mg/dL (ref 70–99)
Glucose-Capillary: 86 mg/dL (ref 70–99)
Glucose-Capillary: 102 mg/dL — ABNORMAL HIGH (ref 70–99)

## 2020-09-09 LAB — PREPARE RBC (CROSSMATCH)

## 2020-09-09 MED ORDER — POTASSIUM CHLORIDE 20 MEQ PO PACK
20.0000 meq | PACK | ORAL | Status: AC
  Administered 2020-09-09 (×2): 20 meq
  Filled 2020-09-09 (×2): qty 1

## 2020-09-09 MED ORDER — DEXTROSE IN LACTATED RINGERS 5 % IV SOLN: INTRAVENOUS | Status: DC

## 2020-09-09 MED ORDER — MAGNESIUM SULFATE 2 GM/50ML IV SOLN
2.0000 g | Freq: Once | INTRAVENOUS | Status: AC
  Administered 2020-09-09: 2 g via INTRAVENOUS
  Filled 2020-09-09: qty 50

## 2020-09-09 MED ORDER — FUROSEMIDE 10 MG/ML IJ SOLN
20.0000 mg | Freq: Once | INTRAMUSCULAR | Status: AC
  Administered 2020-09-09: 20 mg via INTRAVENOUS
  Filled 2020-09-09: qty 2

## 2020-09-09 MED ORDER — POTASSIUM CHLORIDE 10 MEQ/50ML IV SOLN
10.0000 meq | INTRAVENOUS | Status: AC
  Administered 2020-09-09 (×4): 10 meq via INTRAVENOUS
  Filled 2020-09-09 (×4): qty 50

## 2020-09-09 MED ORDER — IPRATROPIUM-ALBUTEROL 0.5-2.5 (3) MG/3ML IN SOLN
3.0000 mL | Freq: Two times a day (BID) | RESPIRATORY_TRACT | Status: DC
  Administered 2020-09-09 – 2020-09-10 (×2): 3 mL via RESPIRATORY_TRACT
  Filled 2020-09-09 (×2): qty 3

## 2020-09-09 MED ORDER — ORAL CARE MOUTH RINSE
15.0000 mL | Freq: Two times a day (BID) | OROMUCOSAL | Status: DC
  Administered 2020-09-09 – 2020-09-10 (×2): 15 mL via OROMUCOSAL

## 2020-09-09 MED ORDER — SODIUM CHLORIDE 0.9% IV SOLUTION: Freq: Once | INTRAVENOUS | Status: AC

## 2020-09-09 NOTE — Progress Notes (Signed)
Called and left message for spouse to call back

## 2020-09-09 NOTE — Progress Notes (Signed)
Nutrition Follow-up  DOCUMENTATION CODES:   Not applicable  INTERVENTION:   D/C Tube feeding. Monitor for diet advancement and adequacy of oral intake after extubation.   NUTRITION DIAGNOSIS:   Inadequate oral intake related to inability to eat as evidenced by NPO status.  Ongoing  GOAL:   Patient will meet greater than or equal to 90% of their needs  Progressing  MONITOR:   Vent status,TF tolerance,Labs  REASON FOR ASSESSMENT:   Ventilator,Consult Enteral/tube feeding initiation and management  ASSESSMENT:   75 yo female admitted with GI bleed. Transferred from [facility] where she was admitted with COPD exacerbation. PMH includes esophageal stenosis requiring dilation, HTN, hypothyroidism, COPD, liver cirrhosis, breast cancer, recent stroke.   5/30 Intubated; S/P upper endoscopy  Found to have bleeding gastric AVM s/p clipping, grade 1 mid-esophageal varices, non-bleeding duodenal bulb AVM treated with APC.  Discussed patient in ICU rounds and with RN today. S/P small bowel enteroscopy yesterday. No active bleeding. Receiving another unit of blood this morning. Tolerating Vital High protein at 20 ml/h.  Plans for extubation today with no plans to re-intubate.  Per GI notes, patient can begin clear liquids and advance as tolerated after extubation.  Labs reviewed. K 3.3 CBG: [phone]  Medications reviewed.  Diet Order:   Diet Order            Diet NPO time specified  Diet effective now                 EDUCATION NEEDS:   Not appropriate for education at this time  Skin:  Skin Assessment: Reviewed RN Assessment (MASD to buttocks)  Last BM:  6/2  Height:   Ht Readings from Last 1 Encounters:  09/06/20 5\' 2"  (1.575 m)    Weight:   Wt Readings from Last 1 Encounters:  09/09/20 72.9 kg   Admission weight 69.9 kg 5/30  Ideal Body Weight:  50 kg  BMI:  Body mass index is 29.4 kg/m.  Estimated Nutritional Needs:   Kcal:   1600-1800  Protein:  90-105 gm  Fluid:  1.6-1.8 L    Lucas Mallow, RD, LDN, CNSC Please refer to Amion for contact information.

## 2020-09-09 NOTE — Progress Notes (Signed)
K 3.3, Mg 1.8 Electrolytes replaced per [facility] electrolyte replacement protocol

## 2020-09-09 NOTE — Progress Notes (Signed)
Pt's OGT and TF discontinued on extubation this afternoon. CBGs have been steadily declining since. Pt unable to safely swallow due to hx of stroke and active AMS. Notified [facility] of potential need for low CBG management.

## 2020-09-09 NOTE — Progress Notes (Signed)
Discussed weaning process with patient's spouse  Patient has been able to tolerate pressure support all morning Still unable to follow commands  He states, patient has stated clearly that she does not want to be kept alive artificially on a ventilator. She is able to breathe easily on minimal pressure support, unclear whether she will be able to protect her airway-this was clearly discussed with the patient's spouse and he is clear that she will not want to be reintubated.  Many more days on the ventilator will likely contributes to diaphragmatic weakness, optimal time to extubate is now.   We will go ahead and plan for extubation No reintubation if she was not to do well

## 2020-09-09 NOTE — Progress Notes (Signed)
NAME:  Tammy Cortez, MRN:  [id number], DOB:  1945-12-30, LOS: 3 ADMISSION DATE:  09/06/2020, CONSULTATION DATE:  09/05/20 REFERRING MD:  NA, CHIEF COMPLAINT:  GI bleed  History of Present Illness:   This is a 75 year old female with history of esophageal stenosis requiring dilation with Dr. Lyndel Safe, HTN, acquired hypothyroidism, COPD, cirrhosis of the liver, HTN,and breast cancer(with right paratracheal lymph node recently seen in 2022 that was pet avid but has not been biopsied as far as we can tell.) who presents from [facility]. She was initally admitted after a COPD exacerbation for presumed aspiration pneumonia and encephailits secondary to this. She was altered presumed to be from aspiration pna. She had a stroke 8 weeks prior to this with right sided deficits thereafter. She was becoming increasingly more septic requiring fluid bolus's and escalation of abx to vancomycin and Zosyn. Accompanying the drop in blood pressures patient had a drop in hgb from 8.6 to 5.0. Also noted to have dark tarry stools.  Shortly after these lab abnormalities were appreciated patient had a large volume hematemesis. Per transferring physicioan patietn has no history of GI bleed in past. She is not a blood thinner regularly. Her INR was 1.0. Given that there is not a GI service at [facility] it was decided to transfer patient to our ICU for for further evaluation of her GI bleed.   Had head CT that showed no acute intracranial pathology on 09/05/20  CT abdomen January 04/30/20 noting liver with cirrhotic morphology.    Pertinent  Medical History  HTN Hypothyroidism Breast cancer with hypermetabolic lymph node that has nbto biopsied yet esophageal stenosis requiring dilation in the past COPD  Significant Hospital Events: Including procedures, antibiotic start and stop dates in addition to other pertinent events   . Patietn admitted to the ICU on 09/04/20 On vanc and zosyn from OSH for aspiration PNA. vanc  and zosyn started  . 5/30 to ICU at [facility]. Octreotide gtt started. Intubated. CVL placed all for EGD. Vanc stopped . S/p EGD with AVM discovered . Status post endoscopy 6/1, no active bleeding noted, AVMs treated with argon plasma coagulation.  No plan for colonoscopy  Interim History / Subjective:  Remains intubated on ventilator, weaning this morning She is not following commands She appears to look towards noise Not withdrawing to painful stimuli  Objective   Blood pressure (!) 95/50, pulse (!) 52, temperature 98.5 F (36.9 C), temperature source Axillary, resp. rate 20, height 5\' 2"  (1.575 m), weight 72.9 kg, SpO2 99 %. CVP:  [8 mmHg-10 mmHg] 8 mmHg  Vent Mode: PSV;CPAP FiO2 (%):  [30 %] 30 % Set Rate:  [20 bmp] 20 bmp Vt Set:  [400 mL] 400 mL PEEP:  [5 cmH20] 5 cmH20 Pressure Support:  [8 cmH20] 8 cmH20 Plateau Pressure:  [8 cmH20-15 cmH20] 15 cmH20   Intake/Output Summary (Last 24 hours) at 09/09/2020 0831 Last data filed at 09/09/2020 0434 Gross per 24 hour  Intake 1560.52 ml  Output 1100 ml  Net 460.52 ml   Filed Weights   09/07/20 0410 09/08/20 0500 09/09/20 0435  Weight: 72.3 kg 73.1 kg 72.9 kg    Examination:  Chronically ill-appearing HENT: Moist oral mucosa, anicteric Pulmonary: Bilateral mild rhonchi Cardiac: S1-S2 appreciated Abdomen: Bowel sounds appreciated GU fair output Neuro: Sedated  Labs/imaging that I havepersonally reviewed  (right click and "Reselect all SmartList Selections" daily)  Labs reviewed-Chem-7 within normal limits CBC with anemia, hematocrit trending down slowly  Resolved Hospital Problem  list   NA  Assessment & Plan:  This is a 75 yo female with history as noted above who presents with GI bleed.  Acute GI bleed -AVM on endoscopy treated with APC and banding -Repeat endoscopy on 6/1 did not reveal any active bleeding -Enteroscopy was nonsignificant for any active bleed -Continue Protonix.  Octreotide discontinued -Appreciate  GI follow-up  Hypotension is stable on pressors -Pressor requirement improving   Metabolic acidosis -Multifactorial  Aspiration pneumonia -today will be day 5 zosyn  Acute hypoxemic respiratory failure Chronic obstructive pulmonary disease -We will continue Brovana, Pulmicort, DuoNeb -We will continue ventilator support -She appears to be weaning today however not able to follow commands   Hypothyroidism  History of CVA -Right upper extremity weakness  History liver cirrhosis -Monitor and trend LFTs   Best Practice (right click and "Reselect all SmartList Selections" daily)   Diet:  Tube Feed  Pain/Anxiety/Delirium protocol (if indicated): Yes (RASS goal -1) VAP protocol (if indicated): Yes DVT prophylaxis: Other (comment) SCDs  GI prophylaxis: PPI Glucose control:  SSI No  Central venous access:  Yes, and it is still needed Arterial line:  N/A Foley:  Yes, and it is still needed Restraints ACTION; IS/IS NOT: is not Restraint type: will evaluate daily Mobility:  bed rest  PT consulted: N/A Studies pending: none Culture data pending: none Last reviewed culture data:today Antibiotics: Zosyn Antibiotic de-escalation: Continue Zosyn at present Stop date: has will complete 5 total days from start date of 5/28   Daily labs: requested Code Status:  DNR Prognosis: Life-threating Last date of multidisciplinary goals of care discussion [I verified DNR with husband. Wants aggressive medical rx but no prolonged vent. No CPR]  Disposition: DNR  The patient is critically ill with multiple organ systems failure and requires high complexity decision making for assessment and support, frequent evaluation and titration of therapies, application of advanced monitoring technologies and extensive interpretation of multiple databases. Critical Care Time devoted to patient care services described in this note independent of APP/resident time (if applicable)  is 32 minutes.   Sherrilyn Rist MD [facility] Personal pager: [phone] If unanswered, please page CCM On-call: [phone]

## 2020-09-09 NOTE — Progress Notes (Signed)
[place] Progress Note Patient Name: Tammy Cortez DOB: [birth year] MRN: [id number]   Date of Service  09/09/2020  HPI/Events of Note  Patient was extubated today and is NPO, CBG 86 mg / dl.  eICU Interventions  D 5 % LR  gtt ordered at 75 ml / hour x 12 hours.        Kerry Kass Markiesha Delia 09/09/2020, 9:31 PM

## 2020-09-09 NOTE — Progress Notes (Addendum)
Daily Rounding Note  09/09/2020, 11:58 AM  LOS: 3 days   SUBJECTIVE:   Chief complaint:   Upper GI bleed, blood loss anemia.  Remains intubated on vent.  Still NPO.  Unable to respond to my questions, commands.  Dark bilious liquid in flexi-seal.    OBJECTIVE:         Vital signs in last 24 hours:    Temp:  [97.6 F (36.4 C)-99.8 F (37.7 C)] 99.2 F (37.3 C) (06/02 1059) Pulse Rate:  [45-80] 78 (06/02 1059) Resp:  [11-24] 22 (06/02 1059) BP: (75-156)/(43-109) 132/75 (06/02 1059) SpO2:  [98 %-100 %] 100 % (06/02 1059) FiO2 (%):  [30 %] 30 % (06/02 0805) Weight:  [72.9 kg] 72.9 kg (06/02 0435) Last BM Date: 09/08/20 Filed Weights   09/07/20 0410 09/08/20 0500 09/09/20 0435  Weight: 72.3 kg 73.1 kg 72.9 kg   General: Alert but not following commands. Heart: RRR. Chest: No struggles breathing.  On ventilator, intubated. Abdomen: Nontender, active bowel sounds, not distended. flexi-seal w deep brown bilious liquid.   Extremities: No CCE.  Feet are warm. Neuro/Psych: Alert.  Not following simple commands.  Minor agitation  Intake/Output from previous day: 06/01 0701 - 06/02 0700 In: 1868.5 [I.V.:1335.7; NG/GT:279.3; IV Piggyback:253.4] Out: 1100 [Urine:400; Stool:700]  Intake/Output this shift: Total I/O In: 136.9 [I.V.:29.9; NG/GT:80; IV Piggyback:27] Out: 1600 [Urine:1600]  Lab Results: Recent Labs    09/08/20 1704 09/08/20 2300 09/09/20 0430  WBC 10.1 9.1 9.1  HGB 8.4* 7.6* 7.3*  HCT 25.8* 22.9* 22.6*  PLT 149* 138* 141*   BMET Recent Labs    09/07/20 0345 09/08/20 0500 09/09/20 0430  NA 142 139 139  K 3.5 3.6 3.3*  CL 118* 111 111  CO2 16* 19* 20*  GLUCOSE 205* 150* 111*  BUN 32* 18 12  CREATININE 0.75 0.63 0.56  CALCIUM 8.0* 8.1* 8.1*   LFT Recent Labs    09/07/20 0345  PROT 4.3*  ALBUMIN 2.1*  AST 84*  ALT 159*  ALKPHOS 49  BILITOT 0.9   PT/INR No results for input(s):  LABPROT, INR in the last 72 hours. Hepatitis Panel No results for input(s): HEPBSAG, HCVAB, HEPAIGM, HEPBIGM in the last 72 hours.  Studies/Results: DG Abd Portable 1V  Result Date: 09/08/2020 CLINICAL DATA:  NG tube placement. EXAM: PORTABLE ABDOMEN - 1 VIEW COMPARISON:  09/06/2020. FINDINGS: NG tube noted with tip over the stomach. No bowel distention. Prior lumbar spine fusion. Visualized hardware intact. Degenerative changes lumbar spine. Aortoiliac and visceral atherosclerotic vascular calcification. Central line noted with tip at cavoatrial junction. IMPRESSION: NG tube noted with tip over the stomach. Electronically Signed   By: Marcello Moores  Register   On: 09/08/2020 16:33    ASSESMENT:   *   Hematemesis, melena, ABL anemia. 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  Prepyloric NG tube trauma. 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  4-5 of these were quite large.  Ringlike esophageal stricture, nonobstructing.  Recently placed Endo Clip seen in the lesser curvature. Hgb nadir 6.5 >> 8.4 >> 7.3.  Currently has third unit of blood transfusing.  *   Cirrhosis of the liver previously diagnosed based on CT scan in January 2022..  This is not from alcohol as she has no history of alcohol use/abuse. Not coagulopathic based on labs from INR assay to 4 days ago.  *  recent CVA about 2 months ago.  No anticoagulation or antiplatelet meds in place PTA.  *   Breast cancer.  Has PET avid right paratracheal lymph node.  *    COPD, presumed aspiration pneumonia.  Remains on ventilator.  *   Noncritical thrombocytopenia.   PLAN   *    Observation with serial CBCs.  *    Plan to extubate the patient soon and not/never reintubate.  *     once patient is extubated, and if safe to swallow, begin clear liquid diet and advance as tolerated.    Azucena Freed  09/09/2020, 11:58 AM Phone [phone]  [phone]  GI ATTENDING  Interval history data reviewed.  Agree with interval progress note as outlined above.  Patient is stable post EGD/small bowel enteroscopy with APC treatment of multiple AVMs.  No active bleeding at the time of the procedure.  Stable from GI perspective.  No new recommendations at present.  Continue with supportive care and transfuse as needed.  Docia Chuck. Geri Seminole., M.D. [facility] Division of Gastroenterology

## 2020-09-09 NOTE — Procedures (Signed)
Extubation Procedure Note  Patient Details:   Name: Tammy Cortez DOB: November 20, 1945 MRN: [id number]   Airway Documentation:    Vent end date: 09/09/20 Vent end time: 1315   Evaluation  O2 sats: stable throughout Complications: No apparent complications Patient did tolerate procedure well. Bilateral Breath Sounds: Clear,Diminished   One way extubation completed.   Esperanza Sheets T 09/09/2020, 3:01 PM

## 2020-09-09 NOTE — Progress Notes (Signed)
This chaplain responded to the RN-Autumn's page for spiritual care. The RN updated the chaplain before the visit. The chaplain discussed the appropriateness of a Palliative Care visit with the RN.  The chaplain understands the Pt. husband-Tammy Cortez has many questions about the Pt. goals of care.  Tammy Cortez accepted the chaplain's presence and reflective listening as he verbalized and sorted through the changes in the Pt. health in the last week. Tammy Cortez questions, "what do I do next?"   The chaplain  affirmed Tammy Cortez' 29 year relationship with the Pt. The chaplain understands the Pt. family-3 children and sister are willing to support Tammy Cortez as needed.   Tammy Cortez accepted the chaplain's invitation for prayer and F/U spiritual care.

## 2020-09-10 DIAGNOSIS — D62 Acute posthemorrhagic anemia: Secondary | ICD-10-CM

## 2020-09-10 DIAGNOSIS — Z7189 Other specified counseling: Secondary | ICD-10-CM

## 2020-09-10 DIAGNOSIS — Z515 Encounter for palliative care: Secondary | ICD-10-CM

## 2020-09-10 LAB — BASIC METABOLIC PANEL
Anion gap: 6 (ref 5–15)
BUN: 11 mg/dL (ref 8–23)
CO2: 20 mmol/L — ABNORMAL LOW (ref 22–32)
Calcium: 8.4 mg/dL — ABNORMAL LOW (ref 8.9–10.3)
Chloride: 115 mmol/L — ABNORMAL HIGH (ref 98–111)
Creatinine, Ser: 0.51 mg/dL (ref 0.44–1.00)
GFR, Estimated: >60 mL/min (ref >60)
Glucose, Bld: 102 mg/dL — ABNORMAL HIGH (ref 70–99)
Potassium: 3.7 mmol/L (ref 3.5–5.1)
Sodium: 141 mmol/L (ref 135–145)

## 2020-09-10 LAB — GLUCOSE, CAPILLARY
Glucose-Capillary: 102 mg/dL — ABNORMAL HIGH (ref 70–99)
Glucose-Capillary: 107 mg/dL — ABNORMAL HIGH (ref 70–99)

## 2020-09-10 LAB — CBC
HCT: 28.8 % — ABNORMAL LOW (ref 36.0–46.0)
Hemoglobin: 9.6 g/dL — ABNORMAL LOW (ref 12.0–15.0)
MCH: 29.5 pg (ref 26.0–34.0)
MCHC: 33.3 g/dL (ref 30.0–36.0)
MCV: 88.6 fL (ref 80.0–100.0)
Platelets: 160 10*3/uL (ref 150–400)
RBC: 3.25 MIL/uL — ABNORMAL LOW (ref 3.87–5.11)
RDW: 15.9 % — ABNORMAL HIGH (ref 11.5–15.5)
WBC: 10.1 10*3/uL (ref 4.0–10.5)
nRBC: 0 % (ref 0.0–0.2)

## 2020-09-10 LAB — TYPE AND SCREEN
ABO/RH(D): AB POS
Antibody Screen: NEGATIVE
Unit division: 0
Unit division: 0
Unit division: 0

## 2020-09-10 LAB — BPAM RBC
Blood Product Expiration Date: 202206082359
Blood Product Expiration Date: 202206272359
Blood Product Expiration Date: 202206282359
ISSUE DATE / TIME: 202205301114
ISSUE DATE / TIME: 202205302311
ISSUE DATE / TIME: 202206021037
Unit Type and Rh: 6200
Unit Type and Rh: 8400
Unit Type and Rh: 8400

## 2020-09-10 MED ORDER — ONDANSETRON HCL 4 MG/2ML IJ SOLN: 4.0000 mg | Freq: Four times a day (QID) | INTRAMUSCULAR | Status: DC | PRN

## 2020-09-10 MED ORDER — LORAZEPAM 2 MG/ML IJ SOLN: 1.0000 mg | Freq: Four times a day (QID) | INTRAMUSCULAR | 0 refills | Status: AC

## 2020-09-10 MED ORDER — HALOPERIDOL 0.5 MG PO TABS: 0.5000 mg | ORAL_TABLET | ORAL | Status: AC | PRN

## 2020-09-10 MED ORDER — ONDANSETRON 4 MG PO TBDP: 4.0000 mg | ORAL_TABLET | Freq: Four times a day (QID) | ORAL | Status: DC | PRN

## 2020-09-10 MED ORDER — ACETAMINOPHEN 650 MG RE SUPP: 650.0000 mg | Freq: Four times a day (QID) | RECTAL | Status: DC | PRN

## 2020-09-10 MED ORDER — GLYCOPYRROLATE 1 MG PO TABS: 1.0000 mg | ORAL_TABLET | ORAL | Status: DC | PRN

## 2020-09-10 MED ORDER — BIOTENE DRY MOUTH MT LIQD: 15.0000 mL | OROMUCOSAL | Status: DC | PRN

## 2020-09-10 MED ORDER — MORPHINE SULFATE (PF) 2 MG/ML IV SOLN
1.0000 mg | Freq: Four times a day (QID) | INTRAVENOUS | Status: DC
  Administered 2020-09-10 (×2): 1 mg via INTRAVENOUS
  Filled 2020-09-10 (×2): qty 1

## 2020-09-10 MED ORDER — POTASSIUM CHLORIDE 10 MEQ/100ML IV SOLN
10.0000 meq | INTRAVENOUS | Status: AC
  Administered 2020-09-10 (×4): 10 meq via INTRAVENOUS
  Filled 2020-09-10 (×4): qty 100

## 2020-09-10 MED ORDER — ACETAMINOPHEN 325 MG PO TABS: 650.0000 mg | ORAL_TABLET | Freq: Four times a day (QID) | ORAL | Status: DC | PRN

## 2020-09-10 MED ORDER — IPRATROPIUM-ALBUTEROL 0.5-2.5 (3) MG/3ML IN SOLN
3.0000 mL | Freq: Two times a day (BID) | RESPIRATORY_TRACT | Status: AC
Start: 2020-09-10 — End: ?

## 2020-09-10 MED ORDER — HYDRALAZINE HCL 20 MG/ML IJ SOLN
10.0000 mg | INTRAMUSCULAR | Status: DC | PRN
  Administered 2020-09-10: 20 mg via INTRAVENOUS
  Filled 2020-09-10: qty 1

## 2020-09-10 MED ORDER — GLYCOPYRROLATE 0.2 MG/ML IJ SOLN
0.2000 mg | INTRAMUSCULAR | Status: DC | PRN
  Administered 2020-09-10: 0.2 mg via INTRAVENOUS
  Filled 2020-09-10: qty 1

## 2020-09-10 MED ORDER — ORAL CARE MOUTH RINSE: 15.0000 mL | Freq: Two times a day (BID) | OROMUCOSAL | 0 refills | Status: AC

## 2020-09-10 MED ORDER — GLYCOPYRROLATE 0.2 MG/ML IJ SOLN: 0.2000 mg | INTRAMUSCULAR | Status: DC | PRN

## 2020-09-10 MED ORDER — BIOTENE DRY MOUTH MT LIQD: 15.0000 mL | OROMUCOSAL | Status: AC | PRN

## 2020-09-10 MED ORDER — ONDANSETRON 4 MG PO TBDP: 4.0000 mg | ORAL_TABLET | Freq: Four times a day (QID) | ORAL | 0 refills | Status: AC | PRN

## 2020-09-10 MED ORDER — MORPHINE SULFATE (PF) 2 MG/ML IV SOLN
2.0000 mg | INTRAVENOUS | Status: DC | PRN
  Administered 2020-09-10: 2 mg via INTRAVENOUS
  Filled 2020-09-10: qty 1

## 2020-09-10 MED ORDER — LORAZEPAM 2 MG/ML IJ SOLN
1.0000 mg | Freq: Four times a day (QID) | INTRAMUSCULAR | Status: DC
  Administered 2020-09-10 (×2): 1 mg via INTRAVENOUS
  Filled 2020-09-10 (×2): qty 1

## 2020-09-10 MED ORDER — LORAZEPAM 2 MG/ML IJ SOLN: 0.5000 mg | INTRAMUSCULAR | 0 refills | Status: AC | PRN

## 2020-09-10 MED ORDER — POLYVINYL ALCOHOL 1.4 % OP SOLN
1.0000 [drp] | Freq: Four times a day (QID) | OPHTHALMIC | Status: DC | PRN
  Filled 2020-09-10: qty 15

## 2020-09-10 MED ORDER — ACETAMINOPHEN 325 MG PO TABS: 650.0000 mg | ORAL_TABLET | Freq: Four times a day (QID) | ORAL | Status: AC | PRN

## 2020-09-10 MED ORDER — HALOPERIDOL LACTATE 5 MG/ML IJ SOLN: 0.5000 mg | INTRAMUSCULAR | Status: DC | PRN

## 2020-09-10 MED ORDER — HALOPERIDOL 0.5 MG PO TABS
0.5000 mg | ORAL_TABLET | ORAL | Status: DC | PRN
  Filled 2020-09-10: qty 1

## 2020-09-10 MED ORDER — MORPHINE SULFATE (PF) 2 MG/ML IV SOLN: 2.0000 mg | INTRAVENOUS | 0 refills | Status: AC | PRN

## 2020-09-10 MED ORDER — GLYCOPYRROLATE 1 MG PO TABS: 1.0000 mg | ORAL_TABLET | ORAL | Status: AC | PRN

## 2020-09-10 MED ORDER — HALOPERIDOL LACTATE 2 MG/ML PO CONC
0.5000 mg | ORAL | Status: DC | PRN
  Filled 2020-09-10: qty 0.3

## 2020-09-10 MED ORDER — LORAZEPAM 2 MG/ML IJ SOLN
0.5000 mg | INTRAMUSCULAR | Status: DC | PRN
  Administered 2020-09-10: 1 mg via INTRAVENOUS
  Filled 2020-09-10: qty 1

## 2020-09-10 MED ORDER — MORPHINE SULFATE (PF) 2 MG/ML IV SOLN: 1.0000 mg | Freq: Four times a day (QID) | INTRAVENOUS | 0 refills | Status: AC

## 2020-09-10 MED ORDER — POLYVINYL ALCOHOL 1.4 % OP SOLN: 1.0000 [drp] | Freq: Four times a day (QID) | OPHTHALMIC | 0 refills | Status: AC | PRN

## 2020-09-10 NOTE — Progress Notes (Signed)
[facility] Progress Note Patient Name: Tammy Cortez DOB: 10-Dec-1945 MRN: [id number]   Date of Service  09/10/2020  HPI/Events of Note  Patient is hypertensive.  eICU Interventions  Hydralazine 10-20 mg iv Q 4 hours PRN SBP > 170 mmHg ordered.        Kerry Kass Koji Niehoff 09/10/2020, 3:29 AM

## 2020-09-10 NOTE — TOC Transition Note (Addendum)
Transition of Care [facility]) - CM/SW Discharge Note   Patient Details  Name: Tammy Cortez MRN: [id number] Date of Birth: 12-01-1945  Transition of Care [facility]) CM/SW Contact:  Bethann Berkshire, [place] Phone Number: 09/10/2020, 2:18 PM   Clinical Narrative:     Patient will DC to: [facility] Anticipated DC date: 09/10/20 Family notified:   Krisi, Azua (Spouse)  [phone] ([place])   Transport by: Corey Harold   Per MD patient ready for DC to [facility]. RN, patient, patient's family, and facility notified of DC. Discharge Summary and FL2 sent to facility. RN to call report prior to discharge [phone].). DC packet on chart. Ambulance transport requested for patient.   CSW will sign off for now as social work intervention is no longer needed. Please consult [place] again if new needs arise.   Final next level of care: [place] Barriers to Discharge: No Barriers Identified   Patient Goals and CMS Choice Patient states their goals for this hospitalization and ongoing recovery are:: Spoke with pt husband; desire for [facility]   Choice offered to / list presented to : Spouse  Discharge Placement              Patient chooses bed at:  [facility]) Patient to be transferred to facility by: [place] Name of family member notified: Sreshta, Cressler (Spouse)   [phone] (Mobile) Patient and family notified of of transfer: 09/10/20  Discharge Plan and Services                                     Social Determinants of Health (SDOH) Interventions     Readmission Risk Interventions No flowsheet data found.

## 2020-09-10 NOTE — Discharge Instructions (Signed)
Inpatient hospice

## 2020-09-10 NOTE — Consult Note (Signed)
Palliative Medicine Inpatient Consult Note  Reason for consult:  Goals of Care "goals of care discussions, hospice care"  HPI:  Per intake H&P --> This is a 75 year old female with history of esophageal stenosis requiring dilation with Dr. Lyndel Safe, HTN, acquired hypothyroidism, COPD, cirrhosis of the liver, HTN,and breast cancer(with right paratracheal lymph node recently seen in 2022 that was pet avid but has not been biopsied as far as we can tell.) who presents from [facility]. She was initally admitted after a COPD exacerbation for presumed aspiration pneumonia and encephailits secondary to this. She was altered presumed to be from aspiration pna. She had a stroke 8 weeks prior to this with right sided deficits thereafter. She was becoming increasingly more septic requiring fluid bolus's and escalation of abx to vancomycin and Zosyn. Accompanying the drop in blood pressures patient had a drop in hgb from 8.6 to 5.0. Also noted to have dark tarry stools.  Shortly after these lab abnormalities were appreciated patient had a large volume hematemesis. Per transferring physicioan patietn has no history of GI bleed in past. She is not a blood thinner regularly. Her INR was 1.0. Given that there is not a GI service at [facility] it was decided to transfer patient to the [facility] ICU for for further evaluation of her GI bleed.   Palliative care has been asked to get involved to further aid in goals of care conversations.   Clinical Assessment/Goals of Care:  *Please note that this is a verbal dictation therefore any spelling or grammatical errors are due to the "[employer] [employer]" system interpretation.  I have reviewed medical records including EPIC notes, labs and imaging, received report from bedside RN, assessed the patient who was abdominal breathing at the time of assessment.    I called patients husband,  to further discuss diagnosis prognosis, GOC, EOL wishes, disposition and options.    I introduced Palliative Medicine as specialized medical care for people living with serious illness. It focuses on providing relief from the symptoms and stress of a serious illness. The goal is to improve quality of life for both the patient and the family.  Is lives in [place], [place].  Been led to her husband, Marcello Moores for the past 29 years. She has 3 children from a prior marriage a daughter who lives in [place], a daughter who lives in [place] and a son who lives in [place].  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  He and her husband met through mutual acquaintances and really "hit it off and started dating greater than 30 years ago.  He has been her source of support since then.  She is a woman of faith and practices within the [facility] denomination.  Marcello Moores shares with me that prior to 10 weeks ago [place] was for the most part doing well.  Though 10 weeks ago she endured a stroke and since then she has been steadily declining.  Most recently she has had a lot of issues with her breathing thought to be related to variety of causes 1 of which being her more severe form of COPD.  We reviewed dorsals ongoing issues with swallowing after her stroke and her continued events of aspiration.  Marcello Moores shares with me that he realizes her clinical state is worsening and we further continue conversations on what she would wish for herself at this juncture.  Marcello Moores shares that they had had multiple  conversations regarding this topic in the past and she would never want to be "hooked up" to multiple machines for the rest of her days.  He was hopeful that she would fare well extubation though I shared with him that she is clinically unstable and at this point in time more of a comfort based approach would be an appropriate consideration.  We talked about transition to comfort measures in house and what that would entail inclusive of medications to  control pain, dyspnea, agitation, nausea, itching, and hiccups.   We discussed stopping all uneccessary measures such as monitoring, blood draws,glucose needle sticks, and frequent vital signs. Utilized reflective listening throughout our time together given the influx of emotions he expressed.   Discussed Alyxandra transitioning to an inpatient hospice home closer to where they live in [place] so her family may see and spend time with her during these final phases of her life.  Marcello Moores' main  goal is that she does not suffer.  Discussed the importance of continued conversation with family and their  medical providers regarding overall plan of care and treatment options, ensuring decisions are within the context of the patients values and GOCs.  Decision Maker: Atleigh Gruen (Spouse) [phone]  SUMMARY OF RECOMMENDATIONS   DNAR/DNI  Comfort focused care  Unrestricted visitation  I will schedule some around-the-clock medications for her ongoing dyspnea and agitation in the form of morphine and Ativan  Spiritual support from chaplaincy appreciated  Appreciate transitions of care team helping to facilitate inpatient hospice referral  Ongoing palliative care support  Code Status/Advance Care Planning: DNAR    Symptom Management:  As per [facility]   Palliative Prophylaxis:   Oral care, turn every 2 hours, delirium precautions, aspiration precautions  Additional Recommendations (Limitations, Scope, Preferences):  Comfort focused care    Psycho-social/Spiritual:   Desire for further Chaplaincy support:  Yes patient is [employer]  Additional Recommendations:  Education on end-of-life processes   Prognosis:  Prognosis is limited to days  Discharge Planning:  Charge will be to an inpatient hospice home if patient remains clinically stable.  Vitals:   09/10/20 0500 09/10/20 0600  BP: (!) 174/105 (!) 150/61  Pulse: 75 76  Resp: (!) 31 (!) 24  Temp:    SpO2: 92% 94%     Intake/Output Summary (Last 24 hours) at 09/10/2020 6269 Last data filed at 09/10/2020 0600 Gross per 24 hour  Intake 1576.36 ml  Output 2950 ml  Net -1373.64 ml   Last Weight  Most recent update: 09/10/2020  6:15 AM   Weight  72.4 kg (159 lb 9.8 oz)           Gen:  Elderly F appears in mild distress HEENT: Dry mucous membranes CV: Regular rate and rhythm  PULM: clear to auscultation bilaterally  ABD: soft/nontender  EXT: No edema  Neuro: Somnolent  PPS: 10%   This conversation/these recommendations were discussed with patient primary care team, Dr. Lake Bells via secure chat  Time In: 0900 Time Out: 1010 Total Time: 70 Greater than 50%  of this time was spent counseling and coordinating care related to the above assessment and plan.  [place] Team Team Cell Phone: [phone] Please utilize secure chat with additional questions, if there is no response within 30 minutes please call the above phone number  Palliative Medicine Team providers are available by phone from 7am to 7pm daily and can be reached through the team cell phone.  Should this patient require  assistance outside of these hours, please call the patient's attending physician.

## 2020-09-10 NOTE — Progress Notes (Addendum)
Daily Rounding Note  09/10/2020, 11:03 AM  LOS: 4 days   SUBJECTIVE:   Chief complaint:  UGI bleed.  Blood loss anemia.      Extubated 2:15 yest afternoon.   Palliative care met with family now DNR and full comfort care.   Spouse is hoping to get her to hospice in [place].  OBJECTIVE:         Vital signs in last 24 hours:    Temp:  [98.6 F (37 C)-99.6 F (37.6 C)] 98.6 F (37 C) (06/03 0721) Pulse Rate:  [69-92] 79 (06/03 1030) Resp:  [13-31] 22 (06/03 1030) BP: (120-203)/(61-117) 143/117 (06/03 1030) SpO2:  [90 %-100 %] 94 % (06/03 1030) FiO2 (%):  [30 %] 30 % (06/02 1301) Weight:  [72.4 kg] 72.4 kg (06/03 0400) Last BM Date: 09/10/20 Filed Weights   09/08/20 0500 09/09/20 0435 09/10/20 0400  Weight: 73.1 kg 72.9 kg 72.4 kg   General: mildly agitated moving limbs   Heart: RRR Chest: no labored breathing.  bil ronchi.   Abdomen: soft, NT.  Active BS  flexi-seal w liquid dark brown stool (not melena).   Extremities: edema in R arm Neuro/Psych:  Non-verbal, confused.    Intake/Output from previous day: 06/02 0701 - 06/03 0700 In: 1349.5 [I.V.:682.9; Blood:315; NG/GT:166.7; IV Piggyback:184.9] Out: 2950 [Urine:2950]  Intake/Output this shift: Total I/O In: 337.9 [I.V.:150; IV Piggyback:188] Out: 650 [Urine:650]  Lab Results: Recent Labs    09/09/20 0430 09/09/20 1504 09/10/20 0016  WBC 9.1 11.7* 10.1  HGB 7.3* 9.4* 9.6*  HCT 22.6* 28.4* 28.8*  PLT 141* 159 160   BMET Recent Labs    09/08/20 0500 09/09/20 0430 09/10/20 0016  NA 139 139 141  K 3.6 3.3* 3.7  CL 111 111 115*  CO2 19* 20* 20*  GLUCOSE 150* 111* 102*  BUN _0 CREATININE 0.63 0.56 0.51  CALCIUM 8.1* 8.1* 8.4*   LFT No results for input(s): PROT, ALBUMIN, AST, ALT, ALKPHOS, BILITOT, BILIDIR, IBILI in the last 72 hours. PT/INR No results for input(s): LABPROT, INR in the last 72 hours. Hepatitis Panel No results for  input(s): HEPBSAG, HCVAB, HEPAIGM, HEPBIGM in the last 72 hours.  Studies/Results: DG Abd Portable 1V  Result Date: 09/08/2020 CLINICAL DATA:  NG tube placement. EXAM: PORTABLE ABDOMEN - 1 VIEW COMPARISON:  09/06/2020. FINDINGS: NG tube noted with tip over the stomach. No bowel distention. Prior lumbar spine fusion. Visualized hardware intact. Degenerative changes lumbar spine. Aortoiliac and visceral atherosclerotic vascular calcification. Central line noted with tip at cavoatrial junction. IMPRESSION: NG tube noted with tip over the stomach. Electronically Signed   By: Marcello Moores  Register   On: 09/08/2020 16:33    ASSESMENT:   *   Hematemesis, melena, ABL anemia. 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  Prepyloric NG tube trauma. 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  4-5 of these were quite large.  Ringlike esophageal stricture, nonobstructing.  Recently placed Endo Clip seen in the lesser curvature. Hgb nadir 6.5 >> 2 PRBCs >> 8.4 >> 7.3 >> 1 PRBC >> 9.6.  *   Cirrhosis of the liver previously diagnosed based on CT scan in January 2022..  This is not from alcohol as she has no history of alcohol use/abuse. Not coagulopathic based on labs from INR assay to 4 days ago.  *  recent CVA about 2 months ago.  No anticoagulation or antiplatelet meds in place PTA.  *   Breast cancer.  Has PET avid right paratracheal lymph node.  *    COPD, presumed aspiration pneumonia.  Extubated 09/09/2020.  *   Azotemia, resolved for the past 3 days.   PLAN   *   GI signing off.    Tammy Cortez  09/10/2020, 11:03 AM Phone [phone]  GI ATTENDING  Interval history and data reviewed.  Agree with interval progress note as outlined above.  No further GI bleeding.  Hemoglobin stable.  Plans for comfort care/hospice noted.  Will sign off.  Docia Chuck. Geri Seminole., M.D. [facility] Division of  Gastroenterology

## 2020-09-10 NOTE — Progress Notes (Signed)
Nutrition Brief Note  Chart reviewed. Pt now transitioning to comfort care.  Plans to d/c with Hospice care. No further nutrition interventions planned at this time.  Please re-consult as needed.   Lucas Mallow, RD, LDN, CNSC Please refer to [facility] for contact information.

## 2020-09-10 NOTE — Progress Notes (Signed)
PTAR picked patient up at 1650 on 09/10/2020. Report given to Steffanie Dunn, Therapist, sports at [facility]. Per Steffanie Dunn, RN the foley, rectal tube, and two PIVs were to be left in place. All questions answered. 1mg  Ativan and 1mg  Morphine scheduled were given prior to PTAR leaving with patient.

## 2020-09-10 NOTE — Progress Notes (Signed)
K 3.7 Electrolytes replaced per [facility] electrolyte replacement protocol

## 2020-09-10 NOTE — Progress Notes (Signed)
Pt's BP began increasing from systolics 210Z to 128-118A starting approx 0100. Notified [facility] of change in BP and no PRN meds ordered.

## 2020-09-10 NOTE — TOC Progression Note (Signed)
Transition of Care [facility]) - Progression Note    Patient Details  Name: Tammy Cortez MRN: [id number] Date of Birth: September 29, 1945  Transition of Care [facility]) CM/SW [place], [place] Phone Number: 09/10/2020, 10:21 AM  Clinical Narrative:     CSW informed plan for hospice facility. CSW called pt spouse and confirmed desire for [facility] in [place]. Informed spouse of process for admission and that [facility] will contact him.   CSW made referral to [facility] for [place] facility. They confirmed that they have beds so if they can accept clinically, transition can likely happen today. She will update CSW once pt is reviewed and she calls spouse.   Expected Discharge Plan: [place] Barriers to Discharge: No Barriers Identified  Expected Discharge Plan and Services Expected Discharge Plan: [place]                                               Social Determinants of Health (SDOH) Interventions    Readmission Risk Interventions No flowsheet data found.

## 2020-09-10 NOTE — Discharge Summary (Signed)
Physician Discharge Summary         Patient ID: Tammy Cortez MRN: [id number] DOB/AGE: [birth year] 75 y.o.  Admit date: 09/06/2020 Discharge date: 09/10/2020  Discharge Diagnoses:   Upper GI bleed Advanced liver cirrhosis COPD Hypertension Breast Cancer Acute metabolic encephalopathy Aspiration pneumonia Hypothyroidism CVA Acute respiratory failure with hypoxemia Metabolic acidosis Hemorrhagic shock   Discharge summary   This is a 75 year old female with an extensive past medical history who presented to our facility in the setting of a significant GI bleed.  She was transferred to [facility] for evaluation and management of the same as [facility] did not have GI medicine.  The patient's hemoglobin dropped from 8.6-5.0 after hospital admission with associated shock.  The patient was moved to the intensive care unit for further management.  On May 30 she had an endoscopy which showed an AVM which was clipped.  On June 1 she had an upper endoscopy and small bowel enteroscopy which showed evidence of small AVMs.  She was noted to have an incidental esophageal stricture.  She was treated with octreotide during her hospitalization.  During the hospitalization she was treated for aspiration pneumonia and also had acute respiratory failure with hypoxemia requiring mechanical ventilation.  She was extubated on June 2.  Palliative care has been consulted because of the advanced nature of the patient's cirrhosis and multiple comorbid illnesses with a very poor prognosis.  The patient's family felt that the best approach was to move for inpatient hospice as her overall condition was poor secondary to multiple comorbid illnesses, the severity of this acute illness, and the low likelihood of long-term survival.  Plans were made for discharge to hospice.  Code status DNR.   Discharge Plan by Active Problems    Acute upper GI bleed: hold further oral therapy unless specifically  intended for symptom management: holding PPI  Acute hepatic encephalopathy with advanced cirrhosis: holding lactulose/rifaximin as the patient is actively dying; using ativan and morphine scheduled and as needed for agitation  Acute respiratory distress in setting of active dying process: mouth care per palliative routine, robinol prn, morphine prn  Anxiety: ativan scheduled and as needed   Significant Hospital tests/ studies    5/30 CT angiogram abdomen> enteric obstruction, non speecific, extensive diverticulosis, moderate stool burden, cirrhosis, cholithiasis, trace bilateral effusions  5/30 S/p EGD with AVM discovered  Status post endoscopy 6/1, no active bleeding noted, AVMs treated with argon plasma coagulation.  No plan for colonoscopy  Procedures   As above Culture data/antimicrobials   none   Consults  GI medicine Palliative care    Discharge Exam: BP 121/72   Pulse 80   Temp 98.6 F (37 C) (Oral)   Resp (!) 23   Ht 5\' 2"  (1.575 m)   Wt 72.4 kg   SpO2 94%   BMI 29.19 kg/m   General:  Resting comfortably in bed HENT: NCAT OP clear PULM: normal effort CV: warm, well perfused GI: non-distended MSK: normal bulk and tone Neuro:drowsy, periodically agitated  Labs at discharge   Lab Results  Component Value Date   CREATININE 0.51 09/10/2020   BUN 11 09/10/2020   NA 141 09/10/2020   K 3.7 09/10/2020   CL 115 (H) 09/10/2020   CO2 20 (L) 09/10/2020   Lab Results  Component Value Date   WBC 10.1 09/10/2020   HGB 9.6 (L) 09/10/2020   HCT 28.8 (L) 09/10/2020   MCV 88.6 09/10/2020   PLT 160 09/10/2020  Lab Results  Component Value Date   ALT 159 (H) 09/07/2020   AST 84 (H) 09/07/2020   ALKPHOS 49 09/07/2020   BILITOT 0.9 09/07/2020   Lab Results  Component Value Date   INR 1.2 09/06/2020    Current radiological studies    DG Abd Portable 1V  Result Date: 09/08/2020 CLINICAL DATA:  NG tube placement. EXAM: PORTABLE ABDOMEN - 1 VIEW  COMPARISON:  09/06/2020. FINDINGS: NG tube noted with tip over the stomach. No bowel distention. Prior lumbar spine fusion. Visualized hardware intact. Degenerative changes lumbar spine. Aortoiliac and visceral atherosclerotic vascular calcification. Central line noted with tip at cavoatrial junction. IMPRESSION: NG tube noted with tip over the stomach. Electronically Signed   By: Marcello Moores  Register   On: 09/08/2020 16:33    Disposition:        Allergies as of 09/10/2020   No Known Allergies     Medication List    STOP taking these medications   albuterol 108 (90 Base) MCG/ACT inhaler Commonly known as: VENTOLIN HFA   amLODipine 5 MG tablet Commonly known as: NORVASC   cholecalciferol 25 MCG (1000 UNIT) tablet Commonly known as: VITAMIN D3   Cyanocobalamin 1000 MCG Caps   fluticasone 50 MCG/ACT nasal spray Commonly known as: FLONASE   gabapentin 300 MG capsule Commonly known as: NEURONTIN   levothyroxine 50 MCG tablet Commonly known as: SYNTHROID   lisinopril 20 MG tablet Commonly known as: ZESTRIL   omeprazole 20 MG capsule Commonly known as: PRILOSEC   simvastatin 10 MG tablet Commonly known as: ZOCOR   traMADol 50 MG tablet Commonly known as: ULTRAM     TAKE these medications   acetaminophen 325 MG tablet Commonly known as: TYLENOL Take 2 tablets (650 mg total) by mouth every 6 (six) hours as needed for mild pain (or Fever >/= 101). What changed:   when to take this  reasons to take this   glycopyrrolate 1 MG tablet Commonly known as: ROBINUL Take 1 tablet (1 mg total) by mouth every 4 (four) hours as needed (excessive secretions).   haloperidol 0.5 MG tablet Commonly known as: HALDOL Take 1 tablet (0.5 mg total) by mouth every 4 (four) hours as needed for agitation (or delirium).   ipratropium-albuterol 0.5-2.5 (3) MG/3ML Soln Commonly known as: DUONEB Take 3 mLs by nebulization 2 (two) times daily. What changed:   when to take this  reasons  to take this   LORazepam 2 MG/ML injection Commonly known as: ATIVAN Inject 0.25-0.5 mLs (0.5-1 mg total) into the vein every hour as needed for anxiety, seizure or sedation.   LORazepam 2 MG/ML injection Commonly known as: ATIVAN Inject 0.5 mLs (1 mg total) into the vein every 6 (six) hours.   morphine 2 MG/ML injection Inject 1-2 mLs (2-4 mg total) into the vein every hour as needed (Dyspnea/Pain).   morphine 2 MG/ML injection Inject 0.5 mLs (1 mg total) into the vein every 6 (six) hours.   mouth rinse Liqd solution 15 mLs by Mouth Rinse route 2 (two) times daily.   antiseptic oral rinse Liqd Apply 15 mLs topically as needed for dry mouth.   ondansetron 4 MG disintegrating tablet Commonly known as: ZOFRAN-ODT Take 1 tablet (4 mg total) by mouth every 6 (six) hours as needed for nausea.   polyvinyl alcohol 1.4 % ophthalmic solution Commonly known as: LIQUIFILM TEARS Place 1 drop into both eyes 4 (four) times daily as needed for dry eyes.  Follow-up appointment   None  Discharge Condition:    poor: actively dying, plan inpatient hospice    Signed: Roselie Awkward 09/10/2020, 1:56 PM

## 2020-10-08 DEATH — deceased

## 2023-02-23 IMAGING — CT CT CTA ABD/PEL W/CM AND/OR W/O CM
2 of 10 series · 8 of 46 positions shown, 14 images · IV contrast (omnipaque)
Comparison: CT the chest, abdomen pelvis-05/11/2020;; 03/24/2015;
abdominal MRI-09/28/2018.

CLINICAL DATA: Melena and anemia.  Concern for GI bleeding.

EXAM:
CTA ABDOMEN AND PELVIS WITHOUT AND WITH CONTRAST
TECHNIQUE: Multidetector CT imaging of the abdomen and pelvis was performed
using the standard protocol during bolus administration of
intravenous contrast. Multiplanar reconstructed images and MIPs were
obtained and reviewed to evaluate the vascular anatomy.
CONTRAST:  75mL OMNIPAQUE IOHEXOL 350 MG/ML SOLN

[Series 8: arterial cor · coronal · arterial · 0.81mm/px · 2 of 117 slices shown, 3 images]
[im 39/117  soft-tissue]
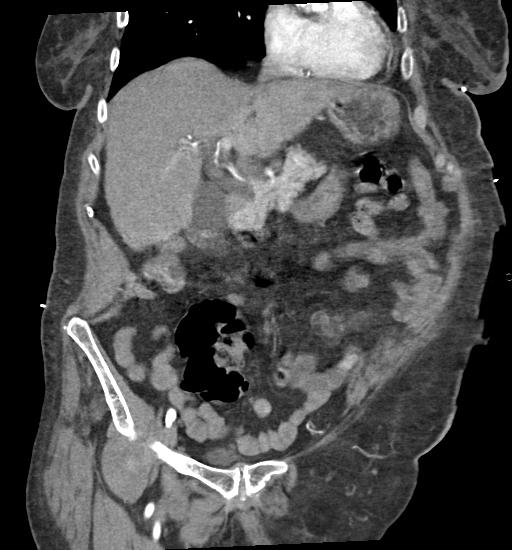
[im 39/117  bone]
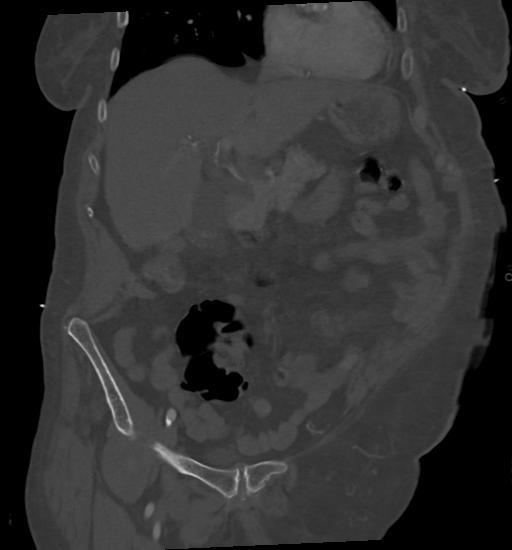
[im 78/117  soft-tissue]
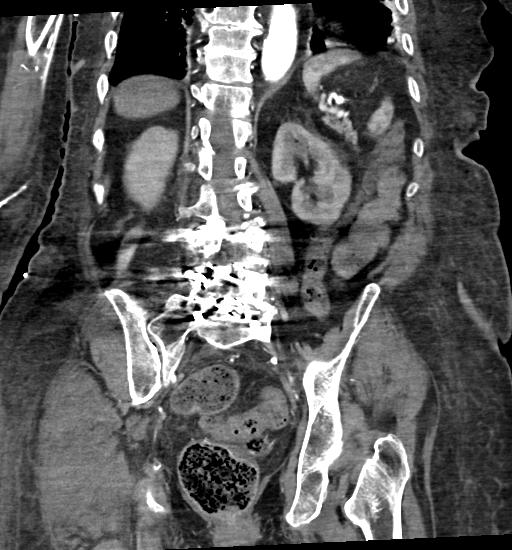

[Series 12: portal venous · axial · portal-venous · 0.93mm/px · z∈[+954,+1264]mm · 6 of 88 slices shown, 11 images]
[im 13/88  soft-tissue]
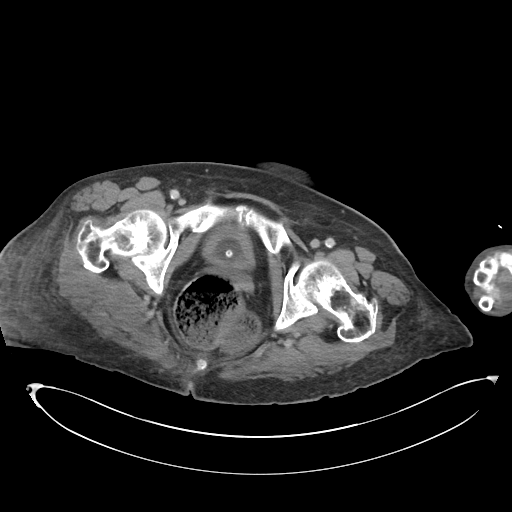
[im 13/88  bone]
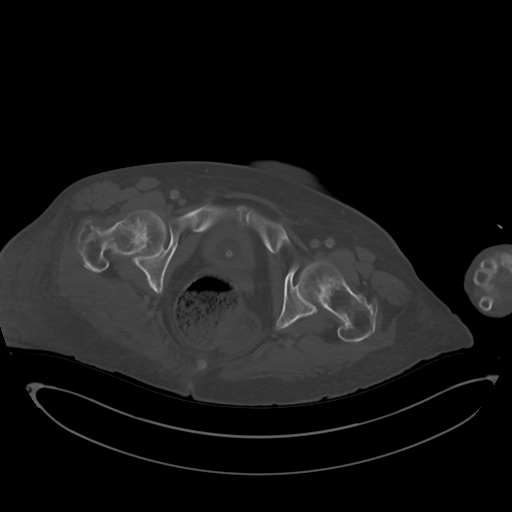
[im 25/88  soft-tissue]
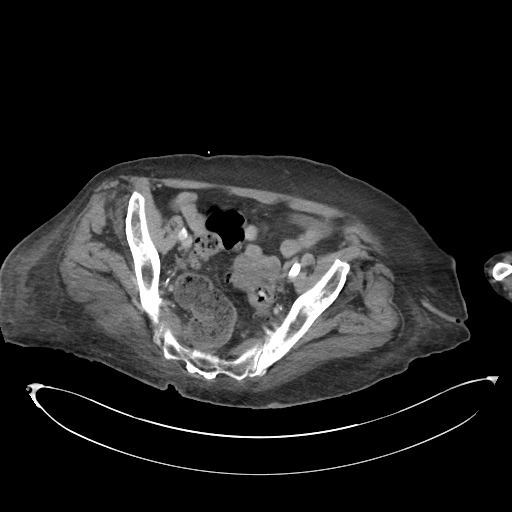
[im 38/88  soft-tissue]
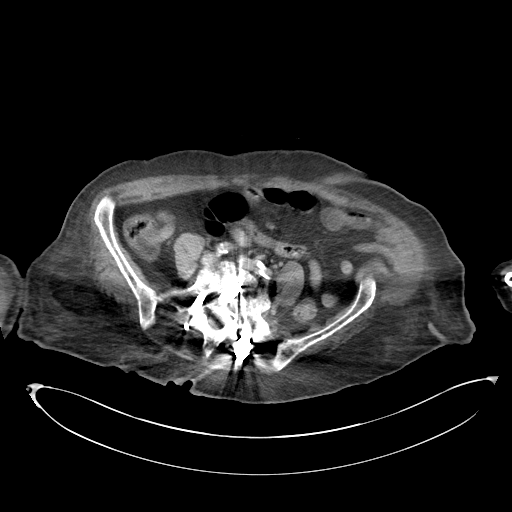
[im 38/88  lung]
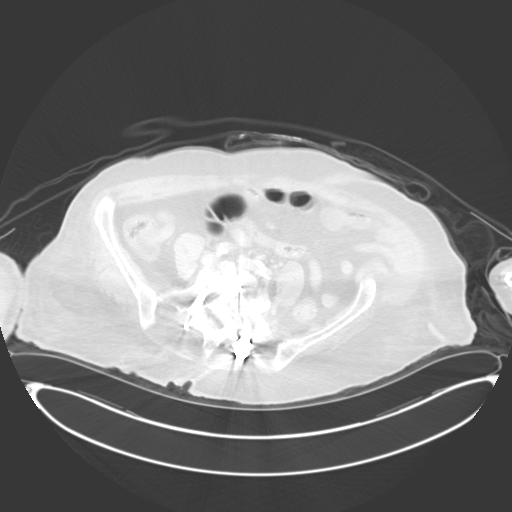
[im 50/88  soft-tissue]
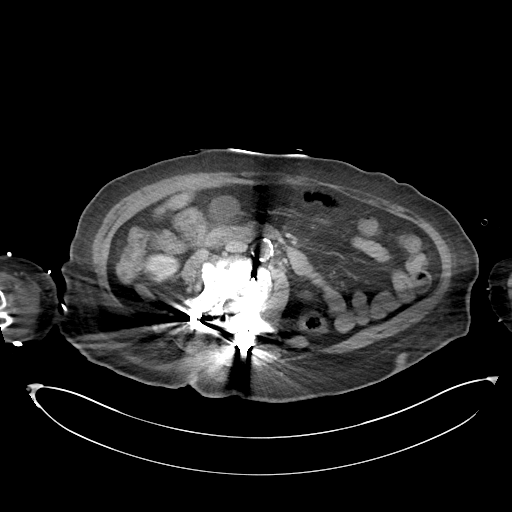
[im 50/88  lung]
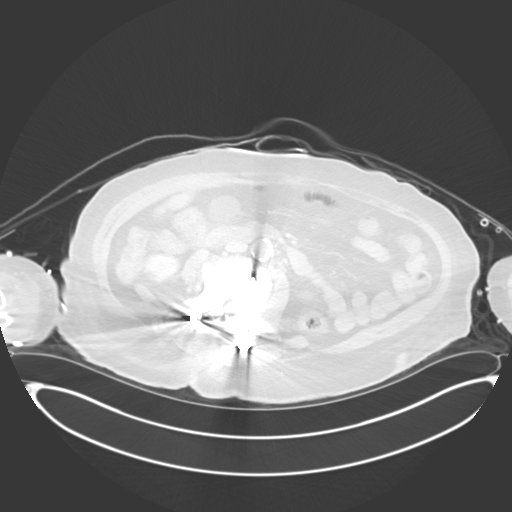
[im 63/88  soft-tissue]
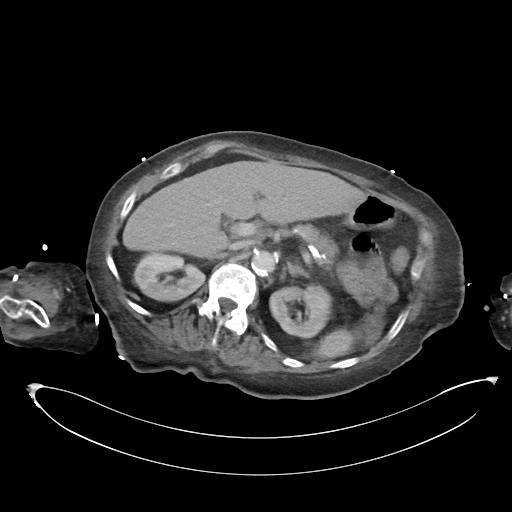
[im 63/88  lung]
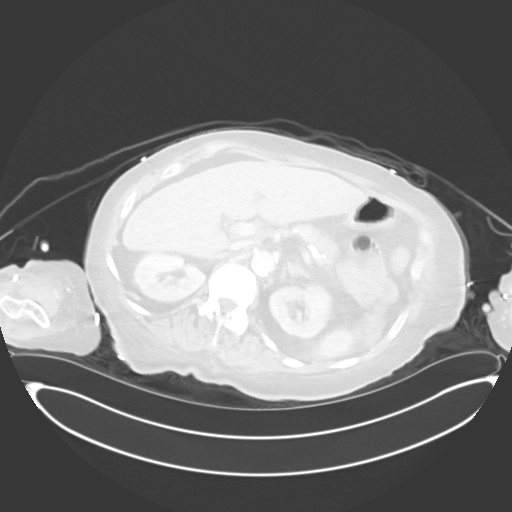
[im 75/88  soft-tissue]
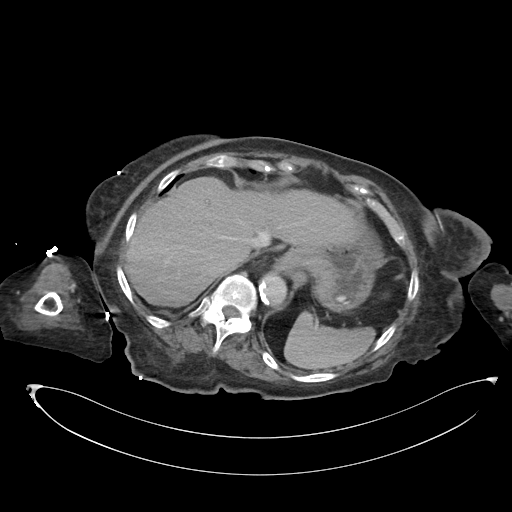
[im 75/88  lung]
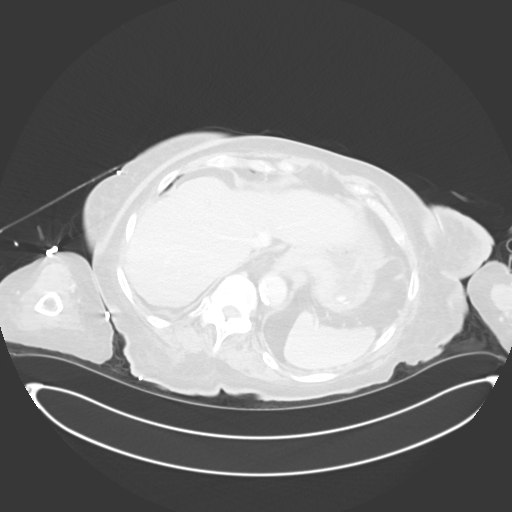

[8 of 46 positions shown; findings below may reference images not displayed]

FINDINGS: VASCULAR

Aorta: There is a moderate amount of calcified atherosclerotic
plaque within a normal caliber abdominal aorta, not resulting in a
hemodynamically significant stenosis. No evidence of abdominal
aortic dissection or periaortic stranding.

Celiac: There is a minimal amount of eccentric calcified
atherosclerotic plaque involving the origin the celiac artery, not
resulting in a hemodynamically significant stenosis. Conventional
branching pattern. Extensive calcifications within the splenic
artery including an approximately 0.8 cm primarily thrombosed
aneurysm at the level of the splenic hilum (coronal image 79, series
10), similar to the [DATE] examination and thus of no clinical
significance.

SMA: There is a moderate amount of eccentric mixed calcified and
noncalcified atherosclerotic plaque involving the origin and
proximal aspect of the SMA which approaches 50% luminal narrowing
(representative sagittal image 97, series 9). Conventional branching
pattern. The distal tributaries the SMA appear widely patent without
discrete internal filling defect to suggest distal embolism.

Renals: Solitary bilaterally; there is a minimal amount of eccentric
calcified atherosclerotic plaque involving the origin of the
bilateral renal arteries, not resulting in a hemodynamically
significant stenosis. No vessel irregularity to suggest FMD.

IMA: Diseased at its origin though remains patent.

Inflow: There is a moderate amount of eccentric prominently
calcified atherosclerotic plaque involving the bilateral common and
external iliac arteries, not definitely resulting in a
hemodynamically significant stenosis. The bilateral internal iliac
arteries are diseased though patent and of normal caliber.

Proximal Outflow: There is a moderate amount of eccentric calcified
atherosclerotic plaque involving the distal aspect of the right
common femoral artery which approaches 50% luminal narrowing (image
131, series 6). There is a minimal amount of eccentric predominantly
calcified atherosclerotic plaque involving the left common femoral
artery, not resulting in a hemodynamically significant stenosis.

The bilateral deep and superficial femoral arteries are of normal
caliber and widely patent throughout their imaged courses.

Veins: The IVC and pelvic venous systems appear widely patent.

Review of the MIP images confirms the above findings.

_________________________________________________________

_________________________________________________________

NON-VASCULAR

Lower chest: Limited visualization of the lower thorax demonstrates
trace bilateral pleural effusions with associated subpleural
consolidative opacities, left greater than right.

Coronary artery calcifications. Calcifications within the mitral
valve annulus. There is persistent deviation of the heart into the
left hemithorax, similar to the 05/11/2020 examination. No
pericardial effusion.

Hepatobiliary: Mild nodularity hepatic contour. There is a punctate
(approximately 0.7 cm) hypoattenuating lesion within the medial
segment of the left lobe of the liver previously characterized as a
hepatic cyst on 09/28/2018. No discrete worrisome hepatic lesions.
There is a punctate stone lying dependently within an otherwise
normal-appearing gallbladder. No gallbladder wall thickening or
pericholecystic stranding. No ascites.

Pancreas: Normal appearance of the pancreas.

Spleen: Normal appearance of the spleen.

Adrenals/Urinary Tract: There is symmetric enhancement of the
bilateral kidneys. Note is made of left-sided parapelvic cysts. No
discrete right-sided renal lesions. No urine obstruction or
perinephric stranding.

Normal appearance of the bilateral adrenal glands. The urinary
bladder is decompressed with a Foley catheter.

Stomach/Bowel: There are no discrete areas of intraluminal contrast
extravasation to suggest the etiology of reported history of GI
bleeding. Two radiopaque pill fragments are seen within the gastric
fundus.

There is apparent mild-to-moderate circumferential colonic wall
thickening without evidence of enteric obstruction. Rather extensive
diverticulosis primarily involving the sigmoid colon. Moderate stool
burden, most conspicuous within the rectum.

Surgical clips about the gastroesophageal junction with persistent
moderate-sized hiatal hernia.

No pneumoperitoneum, pneumatosis or portal venous gas.

Lymphatic: No bulky retroperitoneal, mesenteric, pelvic or inguinal
lymph adenopathy.

Reproductive: Normal appearance of the pelvic organs. No free fluid
in the pelvic cul-de-sac.

Other: Diffuse body wall anasarca.

Musculoskeletal: No acute or aggressive osseous abnormalities. Post
L4-S1 paraspinal fusion and intervertebral disc space replacement.
Unchanged malpositioning of the right L4 pedicular screw extending
to involve the right lateral aspect of the L3-L4 articulation,
similar to remote abdominal CT performed 09/26/2019. redemonstrated
moderate severe DDD within the remainder of the lumbar spine, worse
at L3-L4 with disc space height loss, endplate irregularity and
sclerosis. Stigmata of dish within the lower thoracic spine.
Degenerative change the pubic symphysis. Mild degenerative change
the bilateral hips with joint space loss, subchondral sclerosis and
osteophytosis.
IMPRESSION: VASCULAR

1. No discrete areas of enteric intraluminal contrast extravasation
to suggest etiology of reported history of lower GI bleeding.
2. Moderate amount of atherosclerotic plaque within normal caliber
abdominal aorta. Aortic Atherosclerosis ([id number]).
3. Suspected hemodynamically significant narrowing involving the
origin and proximal aspect of the SMA. The celiac and IMA both
remain patent.
4. Suspected hemodynamically significant narrowing involving the
distal aspect of right common femoral artery. Correlation for
symptoms of right lower extremity PAD is advised.

NON-VASCULAR

1. Apparent diffuse colonic wall thickening without evidence of
enteric obstruction, nonspecific though suggestive of an enteritis,
typically of infectious or inflammatory etiology.
2. Rather extensive colonic diverticulosis, most conspicuously
involving the sigmoid colon, without evidence of superimposed acute
diverticulitis.
3. Moderate colonic stool burden, particularly within the rectal
vault.
4. Stigmata of cirrhosis.  Correlation with LFTs is advised.
5. Cholelithiasis without evidence of cholecystitis.
6. Surgical clips about the GE junction with persistent
moderate-sized hiatal hernia.
7. Trace bilateral effusions with associated bibasilar opacities,
atelectasis versus infiltrate.

## 2023-02-23 IMAGING — DX DG ABDOMEN 1V
2 series · 2 of 2 positions shown · non-contrast
Comparison: CT abdomen pelvis dated 09/06/2020.

CLINICAL DATA: [age] year female status post enteric tube
placement.

EXAM:
ABDOMEN - 1 VIEW

[abdomen kub (1 of 2)]
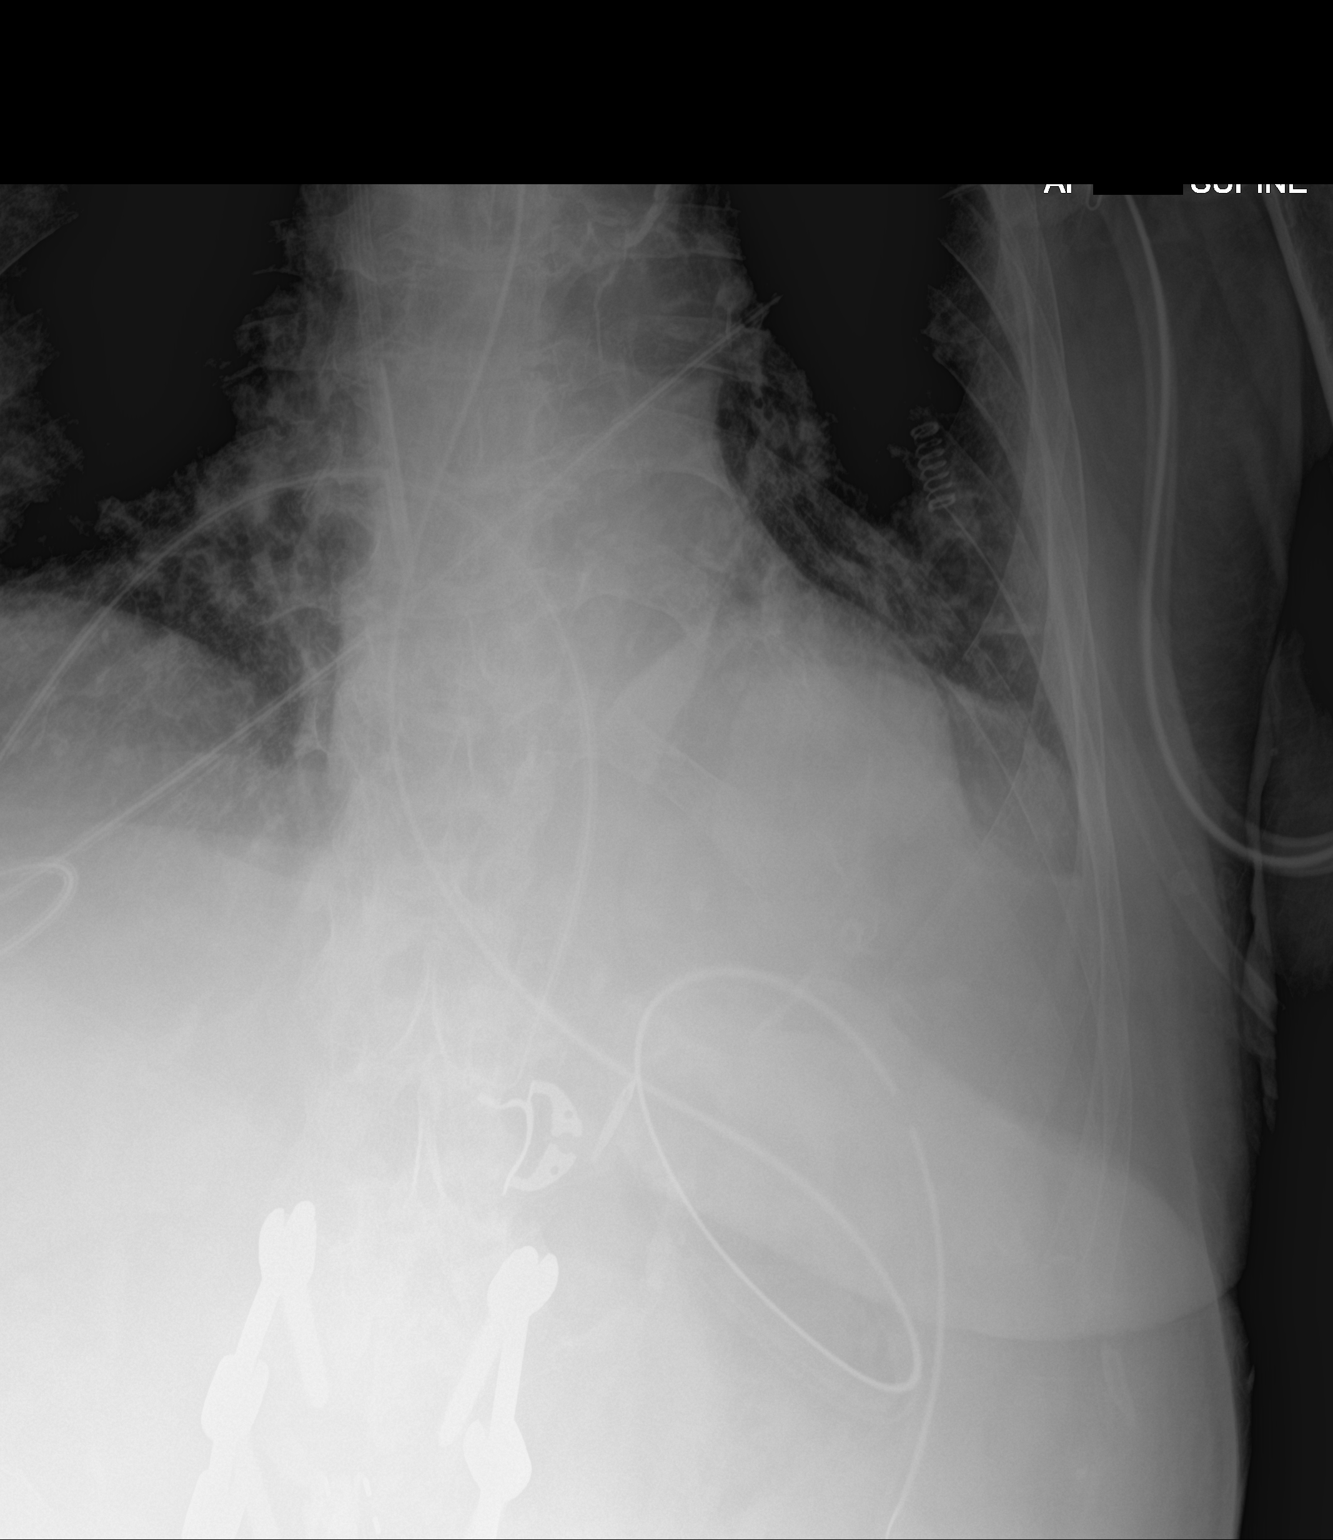

[abdomen kub (2 of 2)]
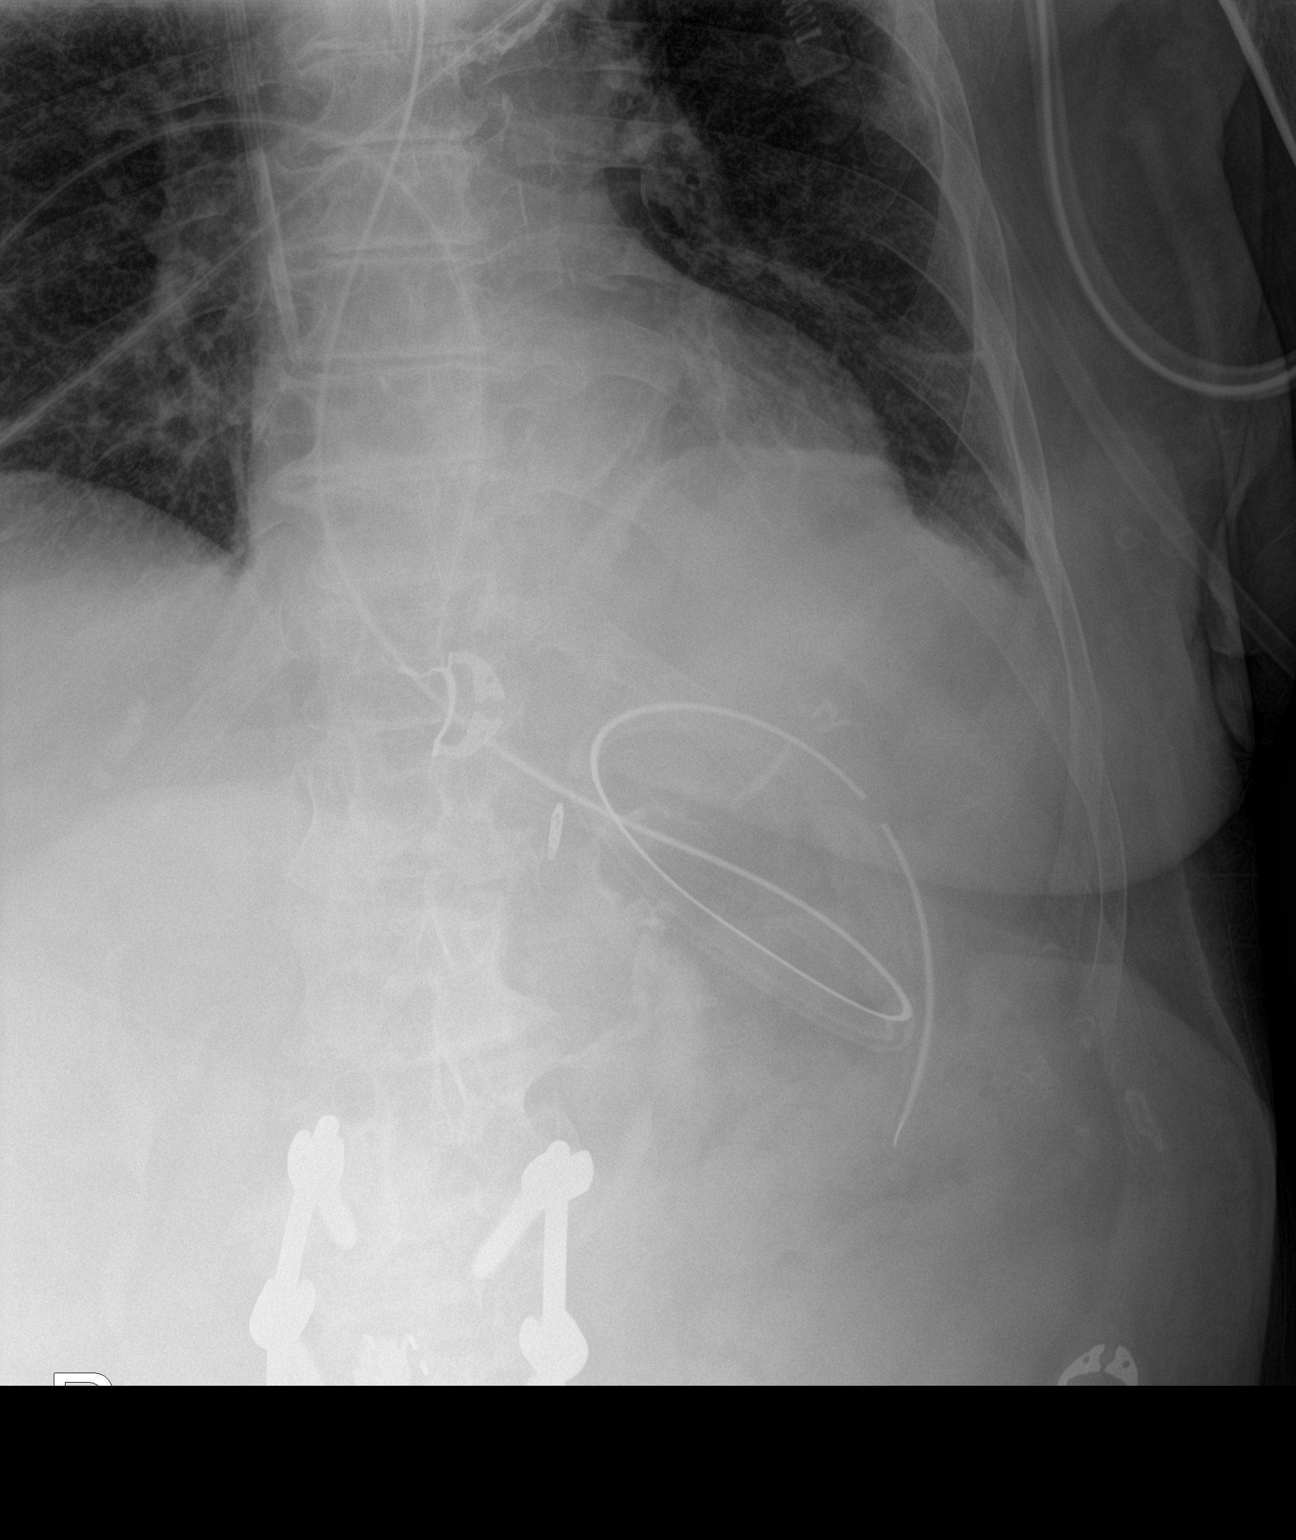

[2 of 2 positions shown; findings below may reference images not displayed]

FINDINGS: Enteric tube makes a loop in the left upper abdomen likely in the
body of the stomach. The tip of the tube is likely in the distal
body of the stomach. No bowel dilatation. A central venous line is
partially visualized with tip close to the cavoatrial junction. The
tip of the endotracheal tube is approximately 11 mm above the
carina. Recommend retraction by 3 cm for optimal positioning. Small
left pleural effusion and left lung base atelectasis.
IMPRESSION: 1. Enteric tube with tip likely in the distal body of the stomach.
2. Endotracheal tube with tip 11 mm above the carina. Recommend
retraction by 3 cm for optimal positioning.
